# Patient Record
Sex: Male | Born: 1980 | Race: White | Hispanic: No | Smoking: Current every day smoker
Health system: Southern US, Community
[De-identification: ages and names within clinical notes are randomized; demographics above are authoritative.]

## PROBLEM LIST (undated history)

## (undated) DIAGNOSIS — G4730 Sleep apnea, unspecified: Secondary | ICD-10-CM

## (undated) DIAGNOSIS — E7800 Pure hypercholesterolemia, unspecified: Secondary | ICD-10-CM

## (undated) DIAGNOSIS — K219 Gastro-esophageal reflux disease without esophagitis: Secondary | ICD-10-CM

## (undated) DIAGNOSIS — E785 Hyperlipidemia, unspecified: Secondary | ICD-10-CM

## (undated) DIAGNOSIS — I10 Essential (primary) hypertension: Secondary | ICD-10-CM

## (undated) DIAGNOSIS — F419 Anxiety disorder, unspecified: Secondary | ICD-10-CM

## (undated) DIAGNOSIS — F32A Depression, unspecified: Secondary | ICD-10-CM

## (undated) HISTORY — DX: Depression, unspecified: F32.A

## (undated) HISTORY — PX: FOOT SURGERY: SHX648

## (undated) HISTORY — DX: Gastro-esophageal reflux disease without esophagitis: K21.9

## (undated) HISTORY — DX: Anxiety disorder, unspecified: F41.9

## (undated) HISTORY — PX: TONSILLECTOMY: SUR1361

## (undated) HISTORY — DX: Hyperlipidemia, unspecified: E78.5

## (undated) HISTORY — DX: Sleep apnea, unspecified: G47.30

## (undated) HISTORY — DX: Essential (primary) hypertension: I10

---

## 2017-06-30 DIAGNOSIS — Z720 Tobacco use: Secondary | ICD-10-CM | POA: Insufficient documentation

## 2017-06-30 DIAGNOSIS — K529 Noninfective gastroenteritis and colitis, unspecified: Secondary | ICD-10-CM | POA: Insufficient documentation

## 2017-06-30 DIAGNOSIS — A419 Sepsis, unspecified organism: Secondary | ICD-10-CM | POA: Insufficient documentation

## 2018-05-18 DIAGNOSIS — E119 Type 2 diabetes mellitus without complications: Secondary | ICD-10-CM | POA: Insufficient documentation

## 2019-05-06 ENCOUNTER — Other Ambulatory Visit: Payer: Self-pay

## 2019-05-06 ENCOUNTER — Encounter (HOSPITAL_COMMUNITY): Payer: Self-pay

## 2019-05-06 DIAGNOSIS — Z5321 Procedure and treatment not carried out due to patient leaving prior to being seen by health care provider: Secondary | ICD-10-CM | POA: Insufficient documentation

## 2019-05-06 DIAGNOSIS — Z202 Contact with and (suspected) exposure to infections with a predominantly sexual mode of transmission: Secondary | ICD-10-CM | POA: Insufficient documentation

## 2019-05-06 LAB — URINALYSIS, ROUTINE W REFLEX MICROSCOPIC
Bacteria, UA: NONE SEEN
Bilirubin Urine: NEGATIVE
Glucose, UA: NEGATIVE mg/dL
Hgb urine dipstick: NEGATIVE
Ketones, ur: NEGATIVE mg/dL
Nitrite: NEGATIVE
Protein, ur: 30 mg/dL — AB
Specific Gravity, Urine: 1.021 (ref 1.005–1.030)
WBC, UA: >50 WBC/hpf — ABNORMAL HIGH (ref 0–5)
pH: 5 (ref 5.0–8.0)

## 2019-05-06 NOTE — ED Triage Notes (Signed)
Pt reports green penile discharge x 4 days. Also reports burning with urination. Endorses recent unprotected sex.

## 2019-05-07 ENCOUNTER — Emergency Department (HOSPITAL_COMMUNITY)
Admission: EM | Admit: 2019-05-07 | Discharge: 2019-05-07 | Disposition: A | Discharge status: Home / Self Care | Payer: Self-pay | Attending: Emergency Medicine | Admitting: Emergency Medicine

## 2019-06-12 DIAGNOSIS — F605 Obsessive-compulsive personality disorder: Secondary | ICD-10-CM | POA: Insufficient documentation

## 2019-06-12 DIAGNOSIS — F4310 Post-traumatic stress disorder, unspecified: Secondary | ICD-10-CM | POA: Insufficient documentation

## 2019-10-02 ENCOUNTER — Other Ambulatory Visit: Payer: Self-pay

## 2019-10-02 ENCOUNTER — Encounter (HOSPITAL_COMMUNITY): Payer: Self-pay

## 2019-10-02 ENCOUNTER — Emergency Department (HOSPITAL_COMMUNITY)
Admission: EM | Admit: 2019-10-02 | Discharge: 2019-10-02 | Disposition: A | Discharge status: Home / Self Care | Payer: BLUE CROSS/BLUE SHIELD | Attending: Emergency Medicine | Admitting: Emergency Medicine

## 2019-10-02 DIAGNOSIS — R197 Diarrhea, unspecified: Secondary | ICD-10-CM | POA: Insufficient documentation

## 2019-10-02 DIAGNOSIS — R112 Nausea with vomiting, unspecified: Secondary | ICD-10-CM | POA: Insufficient documentation

## 2019-10-02 DIAGNOSIS — R1030 Lower abdominal pain, unspecified: Secondary | ICD-10-CM | POA: Diagnosis present

## 2019-10-02 DIAGNOSIS — E860 Dehydration: Secondary | ICD-10-CM | POA: Diagnosis not present

## 2019-10-02 HISTORY — DX: Pure hypercholesterolemia, unspecified: E78.00

## 2019-10-02 LAB — CBC WITH DIFFERENTIAL/PLATELET
Abs Immature Granulocytes: 0.03 10*3/uL (ref 0.00–0.07)
Basophils Absolute: 0 10*3/uL (ref 0.0–0.1)
Basophils Relative: 0 %
Eosinophils Absolute: 0.1 10*3/uL (ref 0.0–0.5)
Eosinophils Relative: 1 %
HCT: 39.5 % (ref 39.0–52.0)
Hemoglobin: 14.4 g/dL (ref 13.0–17.0)
Immature Granulocytes: 0 %
Lymphocytes Relative: 15 %
Lymphs Abs: 1.3 10*3/uL (ref 0.7–4.0)
MCH: 34.2 pg — ABNORMAL HIGH (ref 26.0–34.0)
MCHC: 36.5 g/dL — ABNORMAL HIGH (ref 30.0–36.0)
MCV: 93.8 fL (ref 80.0–100.0)
Monocytes Absolute: 1 10*3/uL (ref 0.1–1.0)
Monocytes Relative: 12 %
Neutro Abs: 6 10*3/uL (ref 1.7–7.7)
Neutrophils Relative %: 72 %
Platelets: 159 10*3/uL (ref 150–400)
RBC: 4.21 MIL/uL — ABNORMAL LOW (ref 4.22–5.81)
RDW: 11.6 % (ref 11.5–15.5)
WBC: 8.4 10*3/uL (ref 4.0–10.5)
nRBC: 0 % (ref 0.0–0.2)

## 2019-10-02 LAB — URINALYSIS, ROUTINE W REFLEX MICROSCOPIC
Bacteria, UA: NONE SEEN
Bilirubin Urine: NEGATIVE
Glucose, UA: NEGATIVE mg/dL
Hgb urine dipstick: NEGATIVE
Ketones, ur: NEGATIVE mg/dL
Leukocytes,Ua: NEGATIVE
Nitrite: NEGATIVE
Protein, ur: 30 mg/dL — AB
Specific Gravity, Urine: 1.029 (ref 1.005–1.030)
pH: 6 (ref 5.0–8.0)

## 2019-10-02 LAB — COMPREHENSIVE METABOLIC PANEL WITH GFR
ALT: 38 U/L (ref 0–44)
AST: 38 U/L (ref 15–41)
Albumin: 4.1 g/dL (ref 3.5–5.0)
Alkaline Phosphatase: 54 U/L (ref 38–126)
Anion gap: 9 (ref 5–15)
BUN: 27 mg/dL — ABNORMAL HIGH (ref 6–20)
CO2: 23 mmol/L (ref 22–32)
Calcium: 8.4 mg/dL — ABNORMAL LOW (ref 8.9–10.3)
Chloride: 105 mmol/L (ref 98–111)
Creatinine, Ser: 1.4 mg/dL — ABNORMAL HIGH (ref 0.61–1.24)
GFR calc Af Amer: >60 mL/min
GFR calc non Af Amer: >60 mL/min
Glucose, Bld: 108 mg/dL — ABNORMAL HIGH (ref 70–99)
Potassium: 3.1 mmol/L — ABNORMAL LOW (ref 3.5–5.1)
Sodium: 137 mmol/L (ref 135–145)
Total Bilirubin: 1.5 mg/dL — ABNORMAL HIGH (ref 0.3–1.2)
Total Protein: 6.5 g/dL (ref 6.5–8.1)

## 2019-10-02 LAB — CK: Total CK: 919 U/L — ABNORMAL HIGH (ref 49–397)

## 2019-10-02 LAB — MAGNESIUM: Magnesium: 2 mg/dL (ref 1.7–2.4)

## 2019-10-02 LAB — LIPASE, BLOOD: Lipase: 21 U/L (ref 11–51)

## 2019-10-02 MED ORDER — ONDANSETRON HCL 4 MG/2ML IJ SOLN
4.0000 mg | Freq: Once | INTRAMUSCULAR | Status: AC
  Administered 2019-10-02: 4 mg via INTRAVENOUS
  Filled 2019-10-02: qty 2

## 2019-10-02 MED ORDER — SODIUM CHLORIDE 0.9 % IV BOLUS
1000.0000 mL | Freq: Once | INTRAVENOUS | Status: AC
  Administered 2019-10-02: 1000 mL via INTRAVENOUS

## 2019-10-02 MED ORDER — LACTATED RINGERS IV BOLUS
1000.0000 mL | Freq: Once | INTRAVENOUS | Status: AC
  Administered 2019-10-02: 1000 mL via INTRAVENOUS

## 2019-10-02 MED ORDER — ONDANSETRON HCL 4 MG PO TABS: 4.0000 mg | ORAL_TABLET | Freq: Three times a day (TID) | ORAL | 0 refills | Status: DC | PRN

## 2019-10-02 NOTE — ED Provider Notes (Signed)
[facility]-EMERGENCY DEPT Provider Note   CSN: [id number] Arrival date & time: 10/02/19  1644     History Chief Complaint  Patient presents with  . Abdominal Pain  . Dehydration    Billy Mann is a 39 y.o. male.  The history is provided by the patient and medical records. No language interpreter was used.  Abdominal Pain  Billy Mann is a 39 y.o. male who presents to the Emergency Department complaining of vomiting and diarrhea. He presents the emergency department complaining of vomiting and diarrhea and feeling dehydrated. He states that he relapsed on meth 3 days ago. He has been injecting IV for two days. Yesterday he developed numerous episodes of vomiting, diarrhea, abdominal cramping, body aches, shortness of breath and temperature to 100. He states he has had no urine output for the last two days. He had similar symptoms in the past related to renal failure. Denies any chest pain, COVID 19 exposures. He has been vaccinated for coronavirus. Prior to ED arrival he received 1500 mL of fluids as well as 4 mg of Zofran IV.    Past Medical History:  Diagnosis Date  . High cholesterol     There are no problems to display for this patient.   History reviewed. No pertinent surgical history.     No family history on file.  Social History   Tobacco Use  . Smoking status: Not on file  Substance Use Topics  . Alcohol use: Never  . Drug use: Yes    Types: Methamphetamines    Home Medications Prior to Admission medications   Medication Sig Start Date End Date Taking? Authorizing Provider  ondansetron (ZOFRAN) 4 MG tablet Take 1 tablet (4 mg total) by mouth every 8 (eight) hours as needed for nausea or vomiting. 10/02/19   Tilden Fossa, MD    Allergies    Penicillins  Review of Systems   Review of Systems  Gastrointestinal: Positive for abdominal pain.  All other systems reviewed and are negative.   Physical Exam Updated Vital  Signs BP 118/77   Pulse 79   Temp 98.4 F (36.9 C) (Oral)   Resp 19   SpO2 100%   Physical Exam Vitals and nursing note reviewed.  Constitutional:      Appearance: He is well-developed.  HENT:     Head: Normocephalic and atraumatic.     Mouth/Throat:     Mouth: Mucous membranes are dry.  Cardiovascular:     Rate and Rhythm: Normal rate and regular rhythm.     Heart sounds: No murmur heard.   Pulmonary:     Effort: Pulmonary effort is normal. No respiratory distress.     Breath sounds: Normal breath sounds.  Abdominal:     Palpations: Abdomen is soft.     Tenderness: There is no abdominal tenderness. There is no guarding or rebound.  Musculoskeletal:        General: No tenderness.  Skin:    General: Skin is warm and dry.  Neurological:     Mental Status: He is alert and oriented to person, place, and time.  Psychiatric:        Behavior: Behavior normal.     ED Results / Procedures / Treatments   Labs (all labs ordered are listed, but only abnormal results are displayed) Labs Reviewed  COMPREHENSIVE METABOLIC PANEL - Abnormal; Notable for the following components:      Result Value   Potassium 3.1 (*)    Glucose, Bld  108 (*)    BUN 27 (*)    Creatinine, Ser 1.40 (*)    Calcium 8.4 (*)    Total Bilirubin 1.5 (*)    All other components within normal limits  CBC WITH DIFFERENTIAL/PLATELET - Abnormal; Notable for the following components:   RBC 4.21 (*)    MCH 34.2 (*)    MCHC 36.5 (*)    All other components within normal limits  URINALYSIS, ROUTINE W REFLEX MICROSCOPIC - Abnormal; Notable for the following components:   Color, Urine AMBER (*)    Protein, ur 30 (*)    All other components within normal limits  CK - Abnormal; Notable for the following components:   Total CK 919 (*)    All other components within normal limits  LIPASE, BLOOD  MAGNESIUM    EKG EKG Interpretation  Date/Time:  Monday October 02 2019 17:53:21 EDT Ventricular Rate:  82 PR  Interval:    QRS Duration: 95 QT Interval:  408 QTC Calculation: 477 R Axis:   63 Text Interpretation: Sinus rhythm Borderline prolonged QT interval Confirmed by Quintella Reichert [phone]) on 10/02/2019 6:14:05 PM   Radiology No results found.  Procedures Procedures (including critical care time)  Medications Ordered in ED Medications  sodium chloride 0.9 % bolus 1,000 mL (0 mLs Intravenous Stopped 10/02/19 2058)  ondansetron (ZOFRAN) injection 4 mg (4 mg Intravenous Given 10/02/19 1851)  lactated ringers bolus 1,000 mL (0 mLs Intravenous Stopped 10/02/19 2058)    ED Course  I have reviewed the triage vital signs and the nursing notes.  Pertinent labs & imaging results that were available during my care of the patient were reviewed by me and considered in my medical decision making (see chart for details).    MDM Rules/Calculators/A&P                         Pt with hx/o substance abuse here for evaluation of vomiting and diarrhea. He is dehydrated on evaluation as well is based on labs with elevation is BUN and creatinine. CPK is mildly elevated consistent with very mild rhabdomyolysis. Patient is eating and drinking in the emergency department without difficulty, making urine. Discussed with patient home care for dehydration plan to discharge home with outpatient follow-up as well as return precautions.  Presentation is not consistent with sepsis, serious bacterial infection, bowel obstruction, appendicitis, cholecystitis. Final Clinical Impression(s) / ED Diagnoses Final diagnoses:  Dehydration  Nausea vomiting and diarrhea    Rx / DC Orders ED Discharge Orders         Ordered    ondansetron (ZOFRAN) 4 MG tablet  Every 8 hours PRN     Discontinue  Reprint     10/02/19 1948           Quintella Reichert, MD 10/02/19 2346

## 2019-10-02 NOTE — ED Triage Notes (Signed)
Pt BIBA from home. Pt c/o lower abd pain, along with N/V/D. Pt relapsed on meth this weekend,  Had 2 days of use. In 2014, pt had rhabdo, concerned it is the same.  130 HR initially  12 lead unremarkable with EMS.  EMS states no saliva in mouth, no urine output.  Received 1500cc fluids en route, 4mg  zofran.  126/83 90 HR after fluids

## 2019-10-02 NOTE — ED Notes (Signed)
Patient offered food and beverage.  

## 2019-10-02 NOTE — ED Notes (Signed)
Patient tolerated food and beverage well.  

## 2019-11-29 ENCOUNTER — Ambulatory Visit (HOSPITAL_COMMUNITY): Payer: Self-pay | Admitting: Licensed Clinical Social Worker

## 2019-11-29 ENCOUNTER — Other Ambulatory Visit: Payer: Self-pay

## 2019-11-29 ENCOUNTER — Encounter (HOSPITAL_COMMUNITY): Payer: Self-pay | Admitting: Psychiatry

## 2019-11-29 ENCOUNTER — Telehealth (INDEPENDENT_AMBULATORY_CARE_PROVIDER_SITE_OTHER): Payer: Self-pay | Admitting: Psychiatry

## 2019-11-29 DIAGNOSIS — F411 Generalized anxiety disorder: Secondary | ICD-10-CM

## 2019-11-29 DIAGNOSIS — F330 Major depressive disorder, recurrent, mild: Secondary | ICD-10-CM

## 2019-11-29 MED ORDER — TRAZODONE HCL 50 MG PO TABS: 50.0000 mg | ORAL_TABLET | Freq: Every day | ORAL | 2 refills | Status: DC

## 2019-11-29 MED ORDER — MIRTAZAPINE 15 MG PO TABS: 15.0000 mg | ORAL_TABLET | Freq: Every day | ORAL | 2 refills | Status: DC

## 2019-11-29 NOTE — Progress Notes (Signed)
Psychiatric Initial Adult Assessment  Virtual Visit via Video Note  I connected with Billy Mann on 11/29/19 at  2:00 PM EDT by a video enabled telemedicine application and verified that I am speaking with the correct person using two identifiers.  Location: Patient: Home Provider: Clinic   I discussed the limitations of evaluation and management by telemedicine and the availability of in person appointments. The patient expressed understanding and agreed to proceed.  I provided 45 minutes of non-face-to-face time during this encounter.     Patient Identification: Billy Mann MRN:  [id number] Date of Evaluation:  11/29/2019 Referral Source:  Chief Complaint: "I've only been taking trazodone. The other meds made me sleepy and I couldn't function"  Visit Diagnosis:    ICD-10-CM   1. Generalized anxiety disorder  F41.1 mirtazapine (REMERON) 15 MG tablet  2. Mild episode of recurrent major depressive disorder (HCC)  F33.0 traZODone (DESYREL) 50 MG tablet    History of Present Illness:  39 year old male seen today for initial psychiatric evaluation.  He has a psychiatric history PTSD, bipolar disorder, alcohol use disorder (in remission), cannabis use disorder (in remission), depression, and OCD.  He is currently being managed on trazodone 50 mg at bedtime, Zoloft 50 mg, Paxil 20 mg, hydroxyzine 25 mg 3 times a day, and gabapentin 100 mg 3 times daily.  Today he notes that his depressive symptoms are improving however endorses anhedonia, insomnia, feelings of guilt, difficulty concentrating, anxiety, and panic attacks.  Patient notes that he stopped taking other medications because they were sedating, cause weight gain, and decreased his sex drive.  Patient endorses auditory hallucinations.  He reports that at times he hears people calling his name or hear people whispering.  Provider that he was adopted as a child.  He notes that he has a close relationship with his biological  sisters as they were adopted by his parents best friend.  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  He informed provider that working there keeps him busy and produces feelings of depression however notes that at times he is still anxious.  Reports that he copes with his anxiety by counseling with one of his cats.  He is agreeable to starting Remeron 15 mg at bedtime to help improve symptoms of depression, anxiety, and sleep. Potential side effects of medication and risks vs benefits of treatment vs non-treatment were explained and discussed. All questions were answered. Patient will follow up in 2 months with provider.  If depressive symptoms, anxiety, and auditory is not reduced patient is agreeable to potentially starting Seroquel and BuSpar.  No other concerns noted at this time.  Associated Signs/Symptoms: Depression Symptoms:  depressed mood, anhedonia, insomnia, feelings of worthlessness/guilt, difficulty concentrating, anxiety, panic attacks, (Hypo) Manic Symptoms:  Distractibility, Flight of Ideas, Impulsivity, Anxiety Symptoms:  Obsessive Compulsive Symptoms:   Cleaning home constantly, Psychotic Symptoms:  Hallucinations: Auditory PTSD Symptoms: NA  Past Psychiatric History: PTSD, bipolar disorder, alcohol use disorder, cannabis use disorder, OCD, and depression  Previous Psychotropic Medications: Trialed Zoloft, Paxil, hydroxyzine, and gabapentin  Substance Abuse History in the last 12 months:  No.  Consequences of Substance Abuse: NA  Past Medical History:  Past Medical History:  Diagnosis Date  . High cholesterol    History reviewed. No pertinent surgical history.  Family Psychiatric History: Patient adopted and does not know biological family history  Family History: History reviewed. No pertinent family history.  Social History:   Social History  Socioeconomic History  . Marital status: Single    Spouse name: Not  on file  . Number of children: Not on file  . Years of education: Not on file  . Highest education level: Not on file  Occupational History  . Not on file  Tobacco Use  . Smoking status: Not on file  Substance and Sexual Activity  . Alcohol use: Never  . Drug use: Yes    Types: Methamphetamines  . Sexual activity: Not on file  Other Topics Concern  . Not on file  Social History Narrative  . Not on file   Social Determinants of Health   Financial Resource Strain:   . Difficulty of Paying Living Expenses:   Food Insecurity:   . Worried About Programme researcher, broadcasting/film/video in the Last Year:   . Barista in the Last Year:   Transportation Needs:   . Freight forwarder (Medical):   Marland Kitchen Lack of Transportation (Non-Medical):   Physical Activity:   . Days of Exercise per Week:   . Minutes of Exercise per Session:   Stress:   . Feeling of Stress :   Social Connections:   . Frequency of Communication with Friends and Family:   . Frequency of Social Gatherings with Friends and Family:   . Attends Religious Services:   . Active Member of Clubs or Organizations:   . Attends Banker Meetings:   Marland Kitchen Marital Status:     Additional Social History: Patient resides in [place] with his boyfriend of 1 year.  They have no children.  He denies alcohol, tobacco, or illicit drug use.  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  Allergies:   Allergies  Allergen Reactions  . Penicillins Anaphylaxis    Metabolic Disorder Labs: No results found for: HGBA1C, MPG No results found for: PROLACTIN No results found for: CHOL, TRIG, HDL, CHOLHDL, VLDL, LDLCALC No results found for: TSH  Therapeutic Level Labs: No results found for: LITHIUM No results found for: CBMZ No results found for: VALPROATE  Current Medications: Current Outpatient Medications  Medication Sig Dispense Refill  . mirtazapine (REMERON) 15 MG tablet Take 1 tablet (15 mg total) by mouth at  bedtime. 30 tablet 2  . ondansetron (ZOFRAN) 4 MG tablet Take 1 tablet (4 mg total) by mouth every 8 (eight) hours as needed for nausea or vomiting. 10 tablet 0  . traZODone (DESYREL) 50 MG tablet Take 1 tablet (50 mg total) by mouth at bedtime. 30 tablet 2   No current facility-administered medications for this visit.    Musculoskeletal: Strength & Muscle Tone: Unable to assess due to telehealth visit Gait & Station: normal, Unable to assess due to telehealth visit Patient leans: N/A  Psychiatric Specialty Exam: Review of Systems  There were no vitals taken for this visit.There is no height or weight on file to calculate BMI.  General Appearance: Well Groomed  Eye Contact:  Good  Speech:  Clear and Coherent and Normal Rate  Volume:  Normal  Mood:  Anxious and Depressed  Affect:  Congruent  Thought Process:  Coherent, Goal Directed and Linear  Orientation:  Full (Time, Place, and Person)  Thought Content:  WDL, Logical and Hallucinations: Auditory  Suicidal Thoughts:  No  Homicidal Thoughts:  No  Memory:  Immediate;   Good Recent;   Good Remote;   Good  Judgement:  Good  Insight:  Good  Psychomotor Activity:  Normal  Concentration:  Concentration: Fair and Attention Span: Good  Recall:  Good  Fund of Knowledge:Good  Language: Good  Akathisia:  No  Handed:  Right  AIMS (if indicated):  Not done  Assets:  Communication Skills Desire for Improvement Financial Resources/Insurance Housing Social Support  ADL's:  Intact  Cognition: WNL  Sleep:  Poor   Screenings:   Assessment and Plan: Patient endorses symptoms of depression, anxiety, and auditory hallucinations. He is agreeable to starting Remeron 15 mg at bedtime to help improve symptoms of depression, anxiety, and sleep. Potential side effects of medication and risks vs benefits of treatment vs non-treatment were explained and discussed. All questions were answered. Patient will follow up in 2 months with provider.  If  depressive symptoms, anxiety, and auditory is not reduced patient is agreeable to potentially starting Seroquel and BuSpar.   1. Mild episode of recurrent major depressive disorder (HCC) Continue- traZODone (DESYREL) 50 MG tablet; Take 1 tablet (50 mg total) by mouth at bedtime.  Dispense: 30 tablet; Refill: 2  2. Generalized anxiety disorder  Continue- mirtazapine (REMERON) 15 MG tablet; Take 1 tablet (15 mg total) by mouth at bedtime.  Dispense: 30 tablet; Refill: 2  Follow-up in 2 months Follow-up with therapy    Shanna Cisco, NP 8/11/20212:21 PM

## 2020-01-03 ENCOUNTER — Other Ambulatory Visit: Payer: Self-pay

## 2020-01-03 ENCOUNTER — Ambulatory Visit: Payer: 59 | Admitting: Podiatry

## 2020-01-03 DIAGNOSIS — M2041 Other hammer toe(s) (acquired), right foot: Secondary | ICD-10-CM | POA: Diagnosis not present

## 2020-01-03 DIAGNOSIS — M79671 Pain in right foot: Secondary | ICD-10-CM | POA: Diagnosis not present

## 2020-01-03 DIAGNOSIS — M2042 Other hammer toe(s) (acquired), left foot: Secondary | ICD-10-CM

## 2020-01-03 DIAGNOSIS — L989 Disorder of the skin and subcutaneous tissue, unspecified: Secondary | ICD-10-CM

## 2020-01-03 DIAGNOSIS — M79672 Pain in left foot: Secondary | ICD-10-CM

## 2020-01-07 NOTE — Progress Notes (Signed)
   HPI: 39 y.o. male presenting today as a new patient for evaluation of pain to the bilateral feet.  Patient states that he developed symptomatic calluses to the bilateral feet that have been present for several years.  They are very painful and tender to palpation.  He has been seeing Dr. Elvin So, [facility], for the calluses on the lesions for several years with minimal relief.  He states that the calluses get trimmed down but they immediately come back.  He only gets relief for a day or 2.  At 1 point they did discuss possible surgery and correction of the hammertoes to alleviate pressure from the ball of his feet.  He presents today for a second opinion and for further treatment and evaluation  Past Medical History:  Diagnosis Date  . High cholesterol       Objective: Physical Exam General: The patient is alert and oriented x3 in no acute distress.  Dermatology: Skin is cool, dry and supple bilateral lower extremities. Negative for open lesions or macerations.  Hyperkeratotic preulcerative callus lesions with a central nucleated core noted throughout the weightbearing surfaces of the bilateral feet especially on the forefoot.  Associated pain to palpation  Vascular: Palpable pedal pulses bilaterally. No edema or erythema noted. Capillary refill within normal limits.  Neurological: Epicritic and protective threshold grossly intact bilaterally.   Musculoskeletal Exam: All pedal and ankle joints range of motion within normal limits bilateral. Muscle strength 5/5 in all groups bilateral. Hammertoe contracture deformity noted to digits 2-5 of the bilateral foot  Assessment: 1.  Porokeratosis bilateral forefoot 2.  Hammertoes 2-5 bilateral   Plan of Care:  1. Patient evaluated.  2.  The patient is attempted and tried multiple conservative modalities to alleviate the patient's pain and symptoms with no relief.  He has tried excisional debridement of the calluses, OTC corn and  callus remover, insoles, and shoe gear modifications with minimal to no relief.  At this point I do believe that surgical intervention and correction of the hammertoes may alleviate pressure from the ball of the foot and reduce pressure from the porokeratosis lesions of the sub-MTPJ's. 3. Today we discussed the conservative versus surgical management of the presenting pathology. The patient opts for surgical management. All possible complications and details of the procedure were explained. All patient questions were answered. No guarantees were expressed or implied. 4. Authorization for surgery was initiated today. Surgery will consist of PIPJ arthroplasty with MTPJ capsulotomy digits 2-5 bilateral 5.  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 Felecia Shelling, DPM    [address] [place], [place] [address]                Office [phone]  Fax [phone]

## 2020-01-19 ENCOUNTER — Telehealth: Payer: Self-pay

## 2020-01-19 NOTE — Telephone Encounter (Signed)
DOS 01/25/2020  HAMMERTOE REPAIR 2-5 B/L - 56979 CAPSULOTOMY 2-5 B/L - [id number]  RECEIVED AUTH FAX FROM [facility].  CPT [id number] & [id number] APPROVED AUTH # [id number]  GOOD FROM 01/18/2020 - 04/17/2020

## 2020-01-23 ENCOUNTER — Other Ambulatory Visit: Payer: Self-pay

## 2020-01-23 ENCOUNTER — Ambulatory Visit (HOSPITAL_COMMUNITY): Payer: 59 | Admitting: Licensed Clinical Social Worker

## 2020-01-23 ENCOUNTER — Telehealth (HOSPITAL_COMMUNITY): Payer: Self-pay | Admitting: Licensed Clinical Social Worker

## 2020-01-23 NOTE — Telephone Encounter (Signed)
LCSW sent two links to pt cell phone  number provided in epic with no response. LCSW f/u with a PC the phone rang twice and a man did answer LCSW asked for [facility]. After about 5 seconds phone disconnected

## 2020-01-24 ENCOUNTER — Encounter: Payer: 59 | Admitting: Podiatry

## 2020-01-25 ENCOUNTER — Other Ambulatory Visit: Payer: Self-pay | Admitting: Podiatry

## 2020-01-25 DIAGNOSIS — M2042 Other hammer toe(s) (acquired), left foot: Secondary | ICD-10-CM | POA: Diagnosis not present

## 2020-01-25 DIAGNOSIS — M2041 Other hammer toe(s) (acquired), right foot: Secondary | ICD-10-CM | POA: Diagnosis not present

## 2020-01-25 MED ORDER — OXYCODONE-ACETAMINOPHEN 10-325 MG PO TABS: 1.0000 | ORAL_TABLET | ORAL | 0 refills | Status: AC | PRN

## 2020-01-25 MED ORDER — IBUPROFEN 800 MG PO TABS: 800.0000 mg | ORAL_TABLET | Freq: Three times a day (TID) | ORAL | 1 refills | Status: DC

## 2020-01-25 NOTE — Progress Notes (Signed)
PRN postop 

## 2020-01-31 ENCOUNTER — Encounter: Payer: 59 | Admitting: Podiatry

## 2020-01-31 ENCOUNTER — Ambulatory Visit (INDEPENDENT_AMBULATORY_CARE_PROVIDER_SITE_OTHER): Payer: 59

## 2020-01-31 ENCOUNTER — Other Ambulatory Visit: Payer: Self-pay

## 2020-01-31 ENCOUNTER — Ambulatory Visit (INDEPENDENT_AMBULATORY_CARE_PROVIDER_SITE_OTHER): Payer: 59 | Admitting: Podiatry

## 2020-01-31 DIAGNOSIS — M2041 Other hammer toe(s) (acquired), right foot: Secondary | ICD-10-CM

## 2020-01-31 DIAGNOSIS — M2042 Other hammer toe(s) (acquired), left foot: Secondary | ICD-10-CM

## 2020-01-31 DIAGNOSIS — Z9889 Other specified postprocedural states: Secondary | ICD-10-CM

## 2020-01-31 MED ORDER — HYDROCODONE-ACETAMINOPHEN 5-325 MG PO TABS: 1.0000 | ORAL_TABLET | Freq: Four times a day (QID) | ORAL | 0 refills | Status: DC | PRN

## 2020-01-31 NOTE — Addendum Note (Signed)
Addended by: Felecia Shelling on: 01/31/2020 02:19 PM   Modules accepted: Orders

## 2020-01-31 NOTE — Progress Notes (Signed)
   Subjective:  Patient presents today status post hammertoe repair digits 2-5 bilateral. DOS: 01/25/2020.  Patient states he is doing very well.  He did have some swelling to the bilateral foot and ankles over the week but it has not progressed past his ankles.  Pain is tolerated however he states that the Percocet is making him nauseous.  He presents for further treatment evaluation no new complaints at this time  Past Medical History:  Diagnosis Date  . High cholesterol       Objective/Physical Exam Neurovascular status intact.  Skin incisions appear to be well coapted with staples intact. No sign of infectious process noted. No dehiscence. No active bleeding noted. Moderate edema noted to the surgical extremity.  Radiographic Exam:  Percutaneous K wires and osteotomies sites appear to be stable with routine healing.  Toes are in a good rectus alignment  Assessment: 1. s/p hammertoe repair digits 2-5 bilateral. DOS: 01/25/2020   Plan of Care:  1. Patient was evaluated. X-rays reviewed 2.  Dressings changed today.  Keep clean dry and intact x1 week 3.  Continue minimal weightbearing in the postsurgical shoes bilateral 4.  Prescription for Vicodin 5/325 mg 5.  Continue ibuprofen 800 mg 3 times daily 6.  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 Felecia Shelling, DPM    [address]                                        [place], [place] [address]                Office [phone]  Fax [phone]

## 2020-02-07 ENCOUNTER — Other Ambulatory Visit: Payer: Self-pay

## 2020-02-07 ENCOUNTER — Ambulatory Visit (INDEPENDENT_AMBULATORY_CARE_PROVIDER_SITE_OTHER): Payer: 59 | Admitting: Podiatry

## 2020-02-07 DIAGNOSIS — L989 Disorder of the skin and subcutaneous tissue, unspecified: Secondary | ICD-10-CM

## 2020-02-07 DIAGNOSIS — M79671 Pain in right foot: Secondary | ICD-10-CM

## 2020-02-07 DIAGNOSIS — M2041 Other hammer toe(s) (acquired), right foot: Secondary | ICD-10-CM

## 2020-02-07 DIAGNOSIS — M2042 Other hammer toe(s) (acquired), left foot: Secondary | ICD-10-CM

## 2020-02-07 DIAGNOSIS — M79672 Pain in left foot: Secondary | ICD-10-CM

## 2020-02-07 DIAGNOSIS — Z9889 Other specified postprocedural states: Secondary | ICD-10-CM

## 2020-02-07 MED ORDER — HYDROCODONE-ACETAMINOPHEN 10-325 MG PO TABS: 1.0000 | ORAL_TABLET | Freq: Four times a day (QID) | ORAL | 0 refills | Status: AC | PRN

## 2020-02-07 NOTE — Progress Notes (Signed)
   Subjective:  Patient presents today status post hammertoe repair digits 2-5 bilateral. DOS: 01/25/2020.  Patient states that he is doing well however the Vicodin 5/325 mg not helping to alleviate his pain.  He states that he is having to take 2 of the pills at the same time.  Otherwise he has been minimally weightbearing and postsurgical shoes as directed.  No new complaints at this time  Past Medical History:  Diagnosis Date  . High cholesterol       Objective/Physical Exam Neurovascular status intact.  Skin incisions appear to be well coapted with staples intact. No sign of infectious process noted. No dehiscence. No active bleeding noted. Moderate edema noted to the surgical extremity.  Radiographic Exam:  Percutaneous K wires and osteotomies sites appear to be stable with routine healing.  Toes are in a good rectus alignment  Assessment: 1. s/p hammertoe repair digits 2-5 bilateral. DOS: 01/25/2020   Plan of Care:  1. Patient was evaluated.  Partial staples removed today 2.  Dressings changed today.  Keep clean dry and intact x1 week 3.  Continue minimal weightbearing in the postsurgical shoes bilateral 4.  Refill prescription for Vicodin 10/325 mg 5.  Continue ibuprofen 800 mg 3 times daily 6.  Return to clinic in 1 week for remaining staple removal.  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 Felecia Shelling, DPM    [address] [place], [place] [address]                Office [phone]  Fax [phone]

## 2020-02-14 ENCOUNTER — Other Ambulatory Visit: Payer: Self-pay

## 2020-02-14 ENCOUNTER — Encounter: Payer: 59 | Admitting: Podiatry

## 2020-02-14 ENCOUNTER — Ambulatory Visit: Payer: 59

## 2020-02-14 ENCOUNTER — Ambulatory Visit (INDEPENDENT_AMBULATORY_CARE_PROVIDER_SITE_OTHER): Payer: 59 | Admitting: Podiatry

## 2020-02-14 DIAGNOSIS — M79672 Pain in left foot: Secondary | ICD-10-CM

## 2020-02-14 DIAGNOSIS — M79671 Pain in right foot: Secondary | ICD-10-CM

## 2020-02-14 MED ORDER — OXYCODONE-ACETAMINOPHEN 5-325 MG PO TABS: 1.0000 | ORAL_TABLET | Freq: Four times a day (QID) | ORAL | 0 refills | Status: DC | PRN

## 2020-02-14 NOTE — Progress Notes (Signed)
   Subjective:  Patient presents today status post hammertoe repair digits 2-5 bilateral. DOS: 01/25/2020.  Patient is doing very well.  No new complaints at this time  Past Medical History:  Diagnosis Date  . High cholesterol       Objective/Physical Exam Neurovascular status intact.  Skin incisions appear to be well coapted with staples intact. No sign of infectious process noted. No dehiscence. No active bleeding noted. Moderate edema noted to the surgical extremity.  Radiographic Exam:  Percutaneous K wires and osteotomies sites appear to be stable with routine healing.  Toes are in a good rectus alignment  Assessment: 1. s/p hammertoe repair digits 2-5 bilateral. DOS: 01/25/2020   Plan of Care:  1. Patient was evaluated.  Remaining staples removed today. 2.  Dry sterile dressings were applied today.  Keep clean dry and intact x2 weeks.  Fresh Ace wraps and stockinettes were supplied for the patient to change throughout the week 3.  Return to clinic in 2 weeks for percutaneous fixation pin removal. 4.  Refill prescription for Percocet 5/325 mg every 6 hours #30  5.  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 Felecia Shelling, DPM    [address] [place], [place] [address]                Office [phone]  Fax [phone]

## 2020-02-19 ENCOUNTER — Telehealth (INDEPENDENT_AMBULATORY_CARE_PROVIDER_SITE_OTHER): Payer: 59 | Admitting: Psychiatry

## 2020-02-19 ENCOUNTER — Encounter (HOSPITAL_COMMUNITY): Payer: Self-pay | Admitting: Psychiatry

## 2020-02-19 ENCOUNTER — Other Ambulatory Visit: Payer: Self-pay

## 2020-02-19 DIAGNOSIS — F605 Obsessive-compulsive personality disorder: Secondary | ICD-10-CM | POA: Diagnosis not present

## 2020-02-19 DIAGNOSIS — F323 Major depressive disorder, single episode, severe with psychotic features: Secondary | ICD-10-CM | POA: Diagnosis not present

## 2020-02-19 DIAGNOSIS — F900 Attention-deficit hyperactivity disorder, predominantly inattentive type: Secondary | ICD-10-CM | POA: Diagnosis not present

## 2020-02-19 DIAGNOSIS — F411 Generalized anxiety disorder: Secondary | ICD-10-CM | POA: Diagnosis not present

## 2020-02-19 MED ORDER — ARIPIPRAZOLE 5 MG PO TABS: 5.0000 mg | ORAL_TABLET | Freq: Every day | ORAL | 2 refills | Status: DC

## 2020-02-19 MED ORDER — TRAZODONE HCL 50 MG PO TABS: 50.0000 mg | ORAL_TABLET | Freq: Every day | ORAL | 2 refills | Status: DC

## 2020-02-19 MED ORDER — ATOMOXETINE HCL 40 MG PO CAPS: 40.0000 mg | ORAL_CAPSULE | Freq: Every day | ORAL | 2 refills | Status: DC

## 2020-02-19 MED ORDER — FLUOXETINE HCL 20 MG PO CAPS: 20.0000 mg | ORAL_CAPSULE | Freq: Every day | ORAL | 2 refills | Status: DC

## 2020-02-19 NOTE — Progress Notes (Signed)
BH MD/PA/NP OP Progress Note Virtual Visit via Video Note  I connected with Billy Mann on 02/19/20 at  8:30 AM EDT by a video enabled telemedicine application and verified that I am speaking with the correct person using two identifiers.  Location: Patient: Home Provider: Clinic   I discussed the limitations of evaluation and management by telemedicine and the availability of in person appointments. The patient expressed understanding and agreed to proceed.  I provided 30 minutes of non-face-to-face time during this encounter.    02/19/2020 9:10 AM Maximos Zayas  MRN:  [id number]  Chief Complaint:  "I'm having a rough time with my anxiety especially with my OCD"   "HPI: 39 year old male seen today for follow up psychiatric evaluation.  He has a psychiatric history PTSD, bipolar disorder, alcohol use disorder (in remission), cannabis use disorder (in remission), depression, ADHD, and OCD.  He is currently being managed on trazodone and Remeron 15 mg nightly. He informed provider that he discontinued his Remeron because it made him sluggish and he noted that he felt like a zombie.  Today well-groomed, pleasant, cooperative, engaged in conversation, and maintained eye contact.  He informed provider that he discontinue Remeron and is now having increased anxiety and depression.  He states that his anxiety is centered around his OCD.  He notes that he received reconstructive surgery on his foot recently and notes he sits around the house worryong.  He notes that when things are not in order he can not get comfortable and stays on edge.  Provider conducted a GAD-7 and patient scored an 18.  He also endorses symptoms of depression such as disturbed sleep, feelings of worthlessness, weight gain (14 lb in 2 months), increased appetite, and poor concentration.  He denies SI/HI or paranoia.  Patient endorses auditory and visual hallucinations.  He notes that he  seea white shadows/people walk next  to him in his home. He also noted that he hears people call his name.   Patient also endorses symptoms of ADHD such as poor concentration, avoidance of mentally taxing task, disorganization, and forgetfulness.  He notes that the symptoms are interfering with his activities of daily living.  Patient reports that he is in school studying emergency management reports that he is unable to focus in class.  Patient agreeable to start Prozac 20 mg to help manage symptoms of depression, anxiety, and OCD.  He will also start Strattera 40 mg to help manage symptoms of ADHD.  He is also agreeable to starting Abilify 5 mg to help manage symptoms of psychosis. Potential side effects of medication and risks vs benefits of treatment vs non-treatment were explained and discussed. All questions were answered. He will continue all other medications as prescribed.  No other concerns noted at this time.  Visit Diagnosis:    ICD-10-CM   1. Obsessive-compulsive personality disorder (HCC)  F60.5 FLUoxetine (PROZAC) 20 MG capsule  2. Attention deficit hyperactivity disorder (ADHD), predominantly inattentive type  F90.0 atomoxetine (STRATTERA) 40 MG capsule  3. Generalized anxiety disorder  F41.1 FLUoxetine (PROZAC) 20 MG capsule  4. 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HCC)  F32.3 traZODone (DESYREL) 50 MG tablet    FLUoxetine (PROZAC) 20 MG capsule    ARIPiprazole (ABILIFY) 5 MG tablet    Past Psychiatric History: PTSD, bipolar disorder, alcohol use disorder, cannabis use disorder, OCD, and depression Past Medical History:  Past Medical History:  Diagnosis Date  . High cholesterol    History reviewed. No pertinent surgical history.  Family Psychiatric History: Patient adopted and does not know biological family history  Family History: History reviewed. No pertinent family history.  Social History:  Social History   Socioeconomic History  . Marital status: Single    Spouse name: Not on file  .  Number of children: Not on file  . Years of education: Not on file  . Highest education level: Not on file  Occupational History  . Not on file  Tobacco Use  . Smoking status: Not on file  Substance and Sexual Activity  . Alcohol use: Never  . Drug use: Yes    Types: Methamphetamines  . Sexual activity: Not on file  Other Topics Concern  . Not on file  Social History Narrative  . Not on file   Social Determinants of Health   Financial Resource Strain:   . Difficulty of Paying Living Expenses: Not on file  Food Insecurity:   . Worried About Programme researcher, broadcasting/film/video in the Last Year: Not on file  . Ran Out of Food in the Last Year: Not on file  Transportation Needs:   . Lack of Transportation (Medical): Not on file  . Lack of Transportation (Non-Medical): Not on file  Physical Activity:   . Days of Exercise per Week: Not on file  . Minutes of Exercise per Session: Not on file  Stress:   . Feeling of Stress : Not on file  Social Connections:   . Frequency of Communication with Friends and Family: Not on file  . Frequency of Social Gatherings with Friends and Family: Not on file  . Attends Religious Services: Not on file  . Active Member of Clubs or Organizations: Not on file  . Attends Banker Meetings: Not on file  . Marital Status: Not on file    Allergies:  Allergies  Allergen Reactions  . Penicillins Anaphylaxis    Metabolic Disorder Labs: No results found for: HGBA1C, MPG No results found for: PROLACTIN No results found for: CHOL, TRIG, HDL, CHOLHDL, VLDL, LDLCALC No results found for: TSH  Therapeutic Level Labs: No results found for: LITHIUM No results found for: VALPROATE No components found for:  CBMZ  Current Medications: Current Outpatient Medications  Medication Sig Dispense Refill  . ARIPiprazole (ABILIFY) 5 MG tablet Take 1 tablet (5 mg total) by mouth daily. 30 tablet 2  . atomoxetine (STRATTERA) 40 MG capsule Take 1 capsule (40 mg  total) by mouth daily. 30 capsule 2  . clindamycin (CLEOCIN) 300 MG capsule Take 300 mg by mouth every 6 (six) hours.    . DESCOVY 200-25 MG tablet Take 1 tablet by mouth daily.    Marland Kitchen FLUoxetine (PROZAC) 20 MG capsule Take 1 capsule (20 mg total) by mouth daily. 30 capsule 2  . gabapentin (NEURONTIN) 100 MG capsule Take 100 mg by mouth 3 (three) times daily.    Marland Kitchen ibuprofen (ADVIL) 800 MG tablet Take 1 tablet (800 mg total) by mouth 3 (three) times daily. 90 tablet 1  . ondansetron (ZOFRAN) 4 MG tablet Take 1 tablet (4 mg total) by mouth every 8 (eight) hours as needed for nausea or vomiting. 10 tablet 0  . oxyCODONE-acetaminophen (PERCOCET) 5-325 MG tablet Take 1 tablet by mouth every 6 (six) hours as needed for severe pain. 30 tablet 0  . traZODone (DESYREL) 50 MG tablet Take 1 tablet (50 mg total) by mouth at bedtime. 30 tablet 2   No current facility-administered medications for this visit.  Musculoskeletal: Strength & Muscle Tone: Unable to assess due to telehealth visit Gait & Station: Unable to assess due to telehealth visit Patient leans: N/A  Psychiatric Specialty Exam: Review of Systems  There were no vitals taken for this visit.There is no height or weight on file to calculate BMI.  General Appearance: Well Groomed  Eye Contact:  Good  Speech:  Clear and Coherent and Normal Rate  Volume:  Normal  Mood:  Anxious and Depressed  Affect:  Appropriate and Congruent  Thought Process:  Coherent, Goal Directed and Linear  Orientation:  Full (Time, Place, and Person)  Thought Content: Logical and Hallucinations: Auditory Visual   Suicidal Thoughts:  No  Homicidal Thoughts:  No  Memory:  Immediate;   Good Recent;   Good Remote;   Good  Judgement:  Good  Insight:  Good  Psychomotor Activity:  Normal  Concentration:  Concentration: Fair and Attention Span: Fair  Recall:  Good  Fund of Knowledge: Good  Language: Good  Akathisia:  No  Handed:  Right  AIMS (if indicated):  Note Done  Assets:  Communication Skills Desire for Improvement Financial Resources/Insurance Housing Social Support  ADL's:  Intact  Cognition: WNL  Sleep:  Good   Screenings: GAD-7     [facility]/19/[facility]  Total GAD-7 Score 18    PHQ2-9     Video Visit from 02/19/2020 in [facility]  PHQ-2 Total Score 1  PHQ-9 Total Score 16       Assessment and Plan Patient endorses symptoms of anxiety, depression, poor concentration, OCD, and VAH. He noted that he discontinued remeron because it "made me feel like a zombie" and start Prozac 20 mg to help manage symptoms of anxiety, depression, and OCD. He is also agreeable to start Strattera 40 mg to help manage symptoms of OCD. He will start Abilify 5 mg to help manage symptoms of psychosis. He will continue all other medications as prescribed.   1. Obsessive-compulsive personality disorder (HCC)  Start- FLUoxetine (PROZAC) 20 MG capsule; Take 1 capsule (20 mg total) by mouth daily.  Dispense: 30 capsule; Refill: 2  2. Attention deficit hyperactivity disorder (ADHD), predominantly inattentive type  Start- atomoxetine (STRATTERA) 40 MG capsule; Take 1 capsule (40 mg total) by mouth daily.  Dispense: 30 capsule; Refill: 2  3. Generalized anxiety disorder  Start- FLUoxetine (PROZAC) 20 MG capsule; Take 1 capsule (20 mg total) by mouth daily.  Dispense: 30 capsule; Refill: 2  4. 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HCC)  Start- FLUoxetine (PROZAC) 20 MG capsule; Take 1 capsule (20 mg total) by mouth daily.  Dispense: 30 capsule; Refill: 2 Start- ARIPiprazole (ABILIFY) 5 MG tablet; Take 1 tablet (5 mg total) by mouth daily.  Dispense: 30 tablet; Refill: 2 Continue- traZODone (DESYREL) 50 MG tablet; Take 1 tablet (50 mg total) by mouth at bedtime.  Dispense: 30 tablet; Refill: 2  Follow up in 3 months Follow up with therapy  Shanna Cisco,  NP 02/19/2020, 9:10 AM

## 2020-02-21 ENCOUNTER — Encounter: Payer: 59 | Admitting: Podiatry

## 2020-02-22 ENCOUNTER — Telehealth: Payer: Self-pay | Admitting: Podiatry

## 2020-02-22 NOTE — Telephone Encounter (Signed)
Patient would like to get a refill on his pain medications.

## 2020-02-23 ENCOUNTER — Telehealth: Payer: Self-pay | Admitting: Podiatry

## 2020-02-23 NOTE — Telephone Encounter (Signed)
Pt called for a second time requesting a refill on pain meds. Please advise.

## 2020-02-23 NOTE — Telephone Encounter (Signed)
Patient called in requesting refill for oxycodone, please advise 

## 2020-02-24 ENCOUNTER — Other Ambulatory Visit: Payer: Self-pay

## 2020-02-24 ENCOUNTER — Other Ambulatory Visit: Payer: Self-pay | Admitting: Podiatry

## 2020-02-24 ENCOUNTER — Ambulatory Visit (HOSPITAL_COMMUNITY)
Admission: RE | Admit: 2020-02-24 | Discharge: 2020-02-24 | Disposition: A | Discharge status: Home / Self Care | Payer: 59 | Source: Ambulatory Visit | Attending: Emergency Medicine | Admitting: Emergency Medicine

## 2020-02-24 ENCOUNTER — Ambulatory Visit (INDEPENDENT_AMBULATORY_CARE_PROVIDER_SITE_OTHER): Payer: 59

## 2020-02-24 ENCOUNTER — Encounter (HOSPITAL_COMMUNITY): Payer: Self-pay

## 2020-02-24 VITALS — BP 150/98 | HR 81 | Temp 98.7°F | Resp 18

## 2020-02-24 DIAGNOSIS — R030 Elevated blood-pressure reading, without diagnosis of hypertension: Secondary | ICD-10-CM

## 2020-02-24 DIAGNOSIS — M79672 Pain in left foot: Secondary | ICD-10-CM | POA: Diagnosis not present

## 2020-02-24 DIAGNOSIS — M79671 Pain in right foot: Secondary | ICD-10-CM

## 2020-02-24 NOTE — ED Provider Notes (Signed)
[facility]    CSN: [id number] Arrival date & time: 02/24/20  1446      History   Chief Complaint Chief Complaint  Patient presents with  . Appointment  . Foot Pain  . Fall    HPI Billy Mann is a 39 y.o. male.   Patient presents with bilateral foot pain since he tripped over his cat and fell on 02/22/2020.  He states he has pain in his toes on both feet and is concerned that he may have damaged his surgical sites.  Patient had bilateral hammertoe repair digits 2-5 on 01/25/2020; he had a follow-up visit with his podiatrist on 02/14/2020 and was noted to be doing well; his next appointment with his podiatrist is 02/28/2020; he states he was unable to get an appointment sooner.  He denies numbness, weakness, paresthesias, drainage from his surgical wounds, fever, or other symptoms.  His medical history also includes PTSD, obsessive-compulsive disorder, anxiety, severe depression with psychotic features, ADHD, tobacco abuse.  The history is provided by the patient and medical records.    Past Medical History:  Diagnosis Date  . High cholesterol     Patient Active Problem List   Diagnosis Date Noted  . Attention deficit hyperactivity disorder (ADHD), 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  . Generalized anxiety disorder 02/19/2020  . 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  . 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  . Post-traumatic stress disorder, unspecified 06/12/2019  . Colitis 06/30/2017  . Sepsis, unspecified organism (HCC) 06/30/2017  . Tobacco abuse 06/30/2017    History reviewed. No pertinent surgical history.     Home Medications    Prior to Admission medications   Medication Sig Start Date End Date Taking? Authorizing Provider  ARIPiprazole (ABILIFY) 5 MG tablet Take 1 tablet (5 mg total) by mouth daily. 02/19/20   Shanna Cisco, NP  atomoxetine (STRATTERA) 40 MG capsule Take 1 capsule (40 mg  total) by mouth daily. 02/19/20   Shanna Cisco, NP  clindamycin (CLEOCIN) 300 MG capsule Take 300 mg by mouth every 6 (six) hours. 10/11/19   [provider]  DESCOVY 200-25 MG tablet Take 1 tablet by mouth daily. 12/15/19   [provider]  FLUoxetine (PROZAC) 20 MG capsule Take 1 capsule (20 mg total) by mouth daily. 02/19/20   Shanna Cisco, NP  gabapentin (NEURONTIN) 100 MG capsule Take 100 mg by mouth 3 (three) times daily. 07/18/19   [provider]  ibuprofen (ADVIL) 800 MG tablet Take 1 tablet (800 mg total) by mouth 3 (three) times daily. 01/25/20   Felecia Shelling, DPM  ondansetron (ZOFRAN) 4 MG tablet Take 1 tablet (4 mg total) by mouth every 8 (eight) hours as needed for nausea or vomiting. 10/02/19   Tilden Fossa, MD  oxyCODONE-acetaminophen (PERCOCET) 5-325 MG tablet Take 1 tablet by mouth every 6 (six) hours as needed for severe pain. 02/14/20   Felecia Shelling, DPM  traZODone (DESYREL) 50 MG tablet Take 1 tablet (50 mg total) by mouth at bedtime. 02/19/20   Shanna Cisco, NP    Family History Family History  Family history unknown: Yes    Social History Social History   Tobacco Use  . Smoking status: Not on file  Substance Use Topics  . Alcohol use: Never  . Drug use: Yes    Types: Methamphetamines     Allergies   Penicillins   Review of Systems Review of Systems  Constitutional: Negative for chills and  fever.  HENT: Negative for ear pain and sore throat.   Eyes: Negative for pain and visual disturbance.  Respiratory: Negative for cough and shortness of breath.   Cardiovascular: Negative for chest pain and palpitations.  Gastrointestinal: Negative for abdominal pain and vomiting.  Genitourinary: Negative for dysuria and hematuria.  Musculoskeletal: Positive for arthralgias. Negative for back pain.  Skin: Negative for color change and rash.  Neurological: Negative for seizures, syncope, weakness and numbness.  All other  systems reviewed and are negative.    Physical Exam Triage Vital Signs ED Triage Vitals  Enc Vitals Group     BP      Pulse      Resp      Temp      Temp src      SpO2      Weight      Height      Head Circumference      Peak Flow      Pain Score      Pain Loc      Pain Edu?      Excl. in GC?    No data found.  Updated Vital Signs BP (!) 150/98 (BP Location: Right Arm)   Pulse 81   Temp 98.7 F (37.1 C) (Oral)   Resp 18   SpO2 96%   Visual Acuity Right Eye Distance:   Left Eye Distance:   Bilateral Distance:    Right Eye Near:   Left Eye Near:    Bilateral Near:     Physical Exam Vitals and nursing note reviewed.  Constitutional:      General: He is not in acute distress.    Appearance: He is well-developed.  HENT:     Head: Normocephalic and atraumatic.     Mouth/Throat:     Mouth: Mucous membranes are moist.  Eyes:     Conjunctiva/sclera: Conjunctivae normal.  Cardiovascular:     Rate and Rhythm: Normal rate and regular rhythm.     Heart sounds: No murmur heard.   Pulmonary:     Effort: Pulmonary effort is normal. No respiratory distress.     Breath sounds: Normal breath sounds.  Abdominal:     Palpations: Abdomen is soft.     Tenderness: There is no abdominal tenderness.  Musculoskeletal:        General: Swelling and tenderness present.     Cervical back: Neck supple.  Skin:    General: Skin is warm and dry.     Comments: Surgical incisions appear to be healing well. Mild edema in toes R>L.   Neurological:     General: No focal deficit present.     Mental Status: He is alert and oriented to person, place, and time.     Sensory: No sensory deficit.     Motor: No weakness.     Gait: Gait abnormal.  Psychiatric:        Mood and Affect: Mood normal.        Behavior: Behavior normal.          UC Treatments / Results  Labs (all labs ordered are listed, but only abnormal results are displayed) Labs Reviewed - No data to  display  EKG   Radiology DG Foot Complete Left  Result Date: 02/24/2020 CLINICAL DATA:  Pain after fall. Hammertoe surgery 1 month ago. Bilateral foot pain for 2 days after tripping over cat leading to fall. EXAM: LEFT FOOT - COMPLETE 3+ VIEW COMPARISON:  Radiograph 01/31/2020  FINDINGS: K-wire fixation of the second through fifth digits with proximal phalangeal osteotomies. K-wire alignment is unchanged from prior exam. No acute fracture or dislocation. No periosteal reaction or bony destruction. There is generalized soft tissue edema overlying the dorsum of the foot. IMPRESSION: 1. Soft tissue edema overlying the dorsum of the foot. No acute osseous abnormality. 2. K-wires within the second through fifth rays, unchanged in appearance from prior exam. Electronically Signed   By: Narda Rutherford M.D.   On: 02/24/2020 16:52   DG Foot Complete Right  Result Date: 02/24/2020 CLINICAL DATA:  Fall, hammertoe surgery 01/25/2020, bilateral foot pain EXAM: RIGHT FOOT COMPLETE - 3+ VIEW COMPARISON:  None. FINDINGS: Intact K-wires in the second through fourth rays terminating approximately at the level of the proximal metatarsals in the second through fourth rays at the level of the distal metatarsal in the fifth right. Resection of the distal heads of the proximal phalanges of the second through fifth toes. No fracture or dislocation. Soft tissue swelling throughout the mid to distal right foot. No significant arthropathy. No osseous erosions or periosteal reaction. IMPRESSION: Soft tissue swelling throughout the mid to distal right foot. No fracture or dislocation. Expected postsurgical changes from reported hammertoe surgical correction with K-wires in the second through fifth rays as detailed. Electronically Signed   By: Delbert Phenix M.D.   On: 02/24/2020 16:51    Procedures Procedures (including critical care time)  Medications Ordered in UC Medications - No data to display  Initial Impression /  Assessment and Plan / UC Course  I have reviewed the triage vital signs and the nursing notes.  Pertinent labs & imaging results that were available during my care of the patient were reviewed by me and considered in my medical decision making (see chart for details).   Bilateral foot pain. Elevated blood pressure reading.  X-rays of the right and left feet show soft tissue swelling; K-wires intact; no fractures. Instructed patient to follow-up with his podiatrist as scheduled on Wednesday. Discussed that his blood pressure is elevated today and needs to be rechecked by his PCP in 2 to 4 weeks. Patient agrees to plan of care.   Final Clinical Impressions(s) / UC Diagnoses   Final diagnoses:  Bilateral foot pain  Elevated blood pressure reading     Discharge Instructions     Follow up as scheduled with your podiatrist.    Your blood pressure is elevated today at 150/98.  Please have this rechecked by your primary care provider in 2-4 weeks.         ED Prescriptions    None     I have reviewed the PDMP during this encounter.   Mickie Bail, NP 02/24/20 1700

## 2020-02-24 NOTE — Discharge Instructions (Addendum)
Follow up as scheduled with your podiatrist.    Your blood pressure is elevated today at 150/98.  Please have this rechecked by your primary care provider in 2-4 weeks.

## 2020-02-24 NOTE — ED Triage Notes (Signed)
Pt presents with bilateral foot pain after a fall on Thursday morning; pt recently had surgery on both feet a few weeks ago.

## 2020-02-26 ENCOUNTER — Encounter (INDEPENDENT_AMBULATORY_CARE_PROVIDER_SITE_OTHER): Payer: Self-pay

## 2020-02-26 ENCOUNTER — Other Ambulatory Visit: Payer: Self-pay | Admitting: Podiatry

## 2020-02-26 MED ORDER — OXYCODONE-ACETAMINOPHEN 5-325 MG PO TABS: 1.0000 | ORAL_TABLET | Freq: Four times a day (QID) | ORAL | 0 refills | Status: DC | PRN

## 2020-02-26 NOTE — Progress Notes (Signed)
PRN pain postop 

## 2020-02-26 NOTE — Telephone Encounter (Signed)
Rx sent just now. - Dr. Logan Bores

## 2020-02-26 NOTE — Telephone Encounter (Signed)
Just sent Rx. - Dr. Logan Bores

## 2020-02-28 ENCOUNTER — Ambulatory Visit (INDEPENDENT_AMBULATORY_CARE_PROVIDER_SITE_OTHER): Payer: 59 | Admitting: Podiatry

## 2020-02-28 ENCOUNTER — Other Ambulatory Visit: Payer: Self-pay

## 2020-02-28 DIAGNOSIS — Z9889 Other specified postprocedural states: Secondary | ICD-10-CM

## 2020-02-28 DIAGNOSIS — M79671 Pain in right foot: Secondary | ICD-10-CM

## 2020-02-28 DIAGNOSIS — M2042 Other hammer toe(s) (acquired), left foot: Secondary | ICD-10-CM

## 2020-02-28 DIAGNOSIS — M2041 Other hammer toe(s) (acquired), right foot: Secondary | ICD-10-CM

## 2020-02-28 DIAGNOSIS — M79672 Pain in left foot: Secondary | ICD-10-CM

## 2020-02-28 NOTE — Progress Notes (Signed)
   Subjective:  Patient presents today status post hammertoe repair digits 2-5 bilateral. DOS: 01/25/2020.  Patient states that he did sustain a trip and fall injury with the emergency department since prior visit.  He states that he hurt the outside of his right foot.  He was evaluated at the ED and x-rays were taken.  He was informed that everything was normal and no damage occurred to the surgical foot.  He is feeling much better today.  Past Medical History:  Diagnosis Date  . High cholesterol       Objective/Physical Exam Neurovascular status intact.  Skin incisions appear to be well coapted with staples intact. No sign of infectious process noted. No dehiscence. No active bleeding noted. Moderate edema noted to the surgical extremity.   Assessment: 1. s/p hammertoe repair digits 2-5 bilateral. DOS: 01/25/2020   Plan of Care:  1. Patient was evaluated.   2.  Percutaneous fixation pins were removed today.  Dressings applied 3.  Continue postsurgical shoe x1 week.  After that he may begin to transition out of the postsurgical shoes into good supportive sneakers 4.  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 Felecia Shelling, DPM    [address] [place], [place] [address]                Office [phone]  Fax [phone]

## 2020-03-11 ENCOUNTER — Other Ambulatory Visit: Payer: Self-pay | Admitting: Podiatry

## 2020-03-11 NOTE — Telephone Encounter (Signed)
Please advise 

## 2020-03-12 MED ORDER — OXYCODONE-ACETAMINOPHEN 5-325 MG PO TABS: 1.0000 | ORAL_TABLET | Freq: Four times a day (QID) | ORAL | 0 refills | Status: DC | PRN

## 2020-03-20 ENCOUNTER — Other Ambulatory Visit: Payer: Self-pay | Admitting: Podiatry

## 2020-03-21 MED ORDER — OXYCODONE-ACETAMINOPHEN 5-325 MG PO TABS: 1.0000 | ORAL_TABLET | Freq: Four times a day (QID) | ORAL | 0 refills | Status: DC | PRN

## 2020-03-21 NOTE — Telephone Encounter (Signed)
Please advise 

## 2020-03-23 ENCOUNTER — Other Ambulatory Visit: Payer: Self-pay | Admitting: Podiatry

## 2020-03-25 NOTE — Telephone Encounter (Signed)
Please advise 

## 2020-03-27 ENCOUNTER — Telehealth: Payer: BLUE CROSS/BLUE SHIELD | Admitting: Internal Medicine

## 2020-04-02 ENCOUNTER — Other Ambulatory Visit: Payer: Self-pay | Admitting: Podiatry

## 2020-04-02 ENCOUNTER — Telehealth (INDEPENDENT_AMBULATORY_CARE_PROVIDER_SITE_OTHER): Payer: 59 | Admitting: Internal Medicine

## 2020-04-02 DIAGNOSIS — E785 Hyperlipidemia, unspecified: Secondary | ICD-10-CM | POA: Diagnosis not present

## 2020-04-02 DIAGNOSIS — Z7689 Persons encountering health services in other specified circumstances: Secondary | ICD-10-CM

## 2020-04-02 DIAGNOSIS — Z13228 Encounter for screening for other metabolic disorders: Secondary | ICD-10-CM

## 2020-04-02 DIAGNOSIS — Z1159 Encounter for screening for other viral diseases: Secondary | ICD-10-CM

## 2020-04-02 DIAGNOSIS — Z130 Encounter for screening for diseases of the blood and blood-forming organs and certain disorders involving the immune mechanism: Secondary | ICD-10-CM

## 2020-04-02 DIAGNOSIS — K219 Gastro-esophageal reflux disease without esophagitis: Secondary | ICD-10-CM

## 2020-04-02 MED ORDER — PANTOPRAZOLE SODIUM 40 MG PO TBEC: 40.0000 mg | DELAYED_RELEASE_TABLET | Freq: Every day | ORAL | 3 refills | Status: DC

## 2020-04-02 MED ORDER — OXYCODONE-ACETAMINOPHEN 5-325 MG PO TABS: 1.0000 | ORAL_TABLET | Freq: Three times a day (TID) | ORAL | 0 refills | Status: DC | PRN

## 2020-04-02 NOTE — Addendum Note (Signed)
Addended by: Heidi Dach on: 04/02/2020 03:39 PM   Modules accepted: Orders

## 2020-04-02 NOTE — Telephone Encounter (Signed)
Please advise 

## 2020-04-02 NOTE — Progress Notes (Signed)
Virtual Visit via Telephone Note  I connected with Kurt Hoffmeier, on 04/02/2020 at 3:28 PM by telephone due to the COVID-19 pandemic and verified that I am speaking with the correct person using two identifiers.   Consent: I discussed the limitations, risks, security and privacy concerns of performing an evaluation and management service by telephone and the availability of in person appointments. I also discussed with the patient that there may be a patient responsible charge related to this service. The patient expressed understanding and agreed to proceed.   Location of Patient: Home   Location of Provider: Clinic    Persons participating in Telemedicine visit: Dastan Krider [facility] Dr. Earlene Plater      History of Present Illness: Patient has a visit to establish care. PMH of Bipolar Disorder, ADHD, GAD---followed by psych.  In past has had HTN and HLD. Reports has been on medications but doesn't remember what it was.   Also has a h/o GERD. Has been chronic issue. Taking OTC medications without relief. Has been on PPI in the past. Has had an endoscopy in the past.    Past Medical History:  Diagnosis Date  . Anxiety    Phreesia 03/30/2020  . Depression    Phreesia 03/30/2020  . GERD (gastroesophageal reflux disease)    Phreesia 03/30/2020  . High cholesterol   . Hyperlipidemia    Phreesia 03/30/2020  . Hypertension    Phreesia 03/30/2020  . Sleep apnea    Phreesia 03/30/2020   Allergies  Allergen Reactions  . Penicillins Anaphylaxis    Current Outpatient Medications on File Prior to Visit  Medication Sig Dispense Refill  . ARIPiprazole (ABILIFY) 5 MG tablet Take 1 tablet (5 mg total) by mouth daily. 30 tablet 2  . atomoxetine (STRATTERA) 40 MG capsule Take 1 capsule (40 mg total) by mouth daily. 30 capsule 2  . emtricitabine-tenofovir (TRUVADA) 200-300 MG tablet Take 1 tablet by mouth daily.    Marland Kitchen FLUoxetine (PROZAC) 20 MG capsule Take 1 capsule (20  mg total) by mouth daily. 30 capsule 2  . ibuprofen (ADVIL) 800 MG tablet TAKE 1 TABLET(800 MG) BY MOUTH THREE TIMES DAILY 90 tablet 1  . traZODone (DESYREL) 50 MG tablet Take 1 tablet (50 mg total) by mouth at bedtime. 30 tablet 2   No current facility-administered medications on file prior to visit.    Observations/Objective: NAD. Speaking clearly.  Work of breathing normal.  Alert and oriented. Mood appropriate.   Assessment and Plan: 1. Encounter to establish care Reviewed patient's PMH, social history, surgical history, and medications.   2. Hyperlipidemia, unspecified hyperlipidemia type - Lipid Panel; Future  3. Screening for deficiency anemia - CBC with Differential; Future  4. Screening for metabolic disorder - Comprehensive metabolic panel; Future  5. Need for hepatitis C screening test - HCV Ab w/Rflx to Verification; Future  6. Gastroesophageal reflux disease, unspecified whether esophagitis present Patient to come for H. Pylori testing prior to starting PPI. Trial PPI. If no relief in 30 days, refer to GI.  - pantoprazole (PROTONIX) 40 MG tablet; Take 1 tablet (40 mg total) by mouth daily.  Dispense: 30 tablet; Refill: 3 - H. pylori Screen; Future    Follow Up Instructions: Lab visit 12/16   I discussed the assessment and treatment plan with the patient. The patient was provided an opportunity to ask questions and all were answered. The patient agreed with the plan and demonstrated an understanding of the instructions.   The patient was advised  to call back or seek an in-person evaluation if the symptoms worsen or if the condition fails to improve as anticipated.     I provided 8 minutes total of non-face-to-face time during this encounter including median intraservice time, reviewing previous notes, investigations, ordering medications, medical decision making, coordinating care and patient verbalized understanding at the end of the visit.    Marcy Siren, D.O. Primary Care at [facility]  04/02/2020, 3:28 PM

## 2020-04-03 ENCOUNTER — Encounter: Payer: Self-pay | Admitting: Podiatry

## 2020-04-03 ENCOUNTER — Other Ambulatory Visit: Payer: Self-pay

## 2020-04-03 ENCOUNTER — Ambulatory Visit (INDEPENDENT_AMBULATORY_CARE_PROVIDER_SITE_OTHER): Payer: 59 | Admitting: Podiatry

## 2020-04-03 DIAGNOSIS — Z9889 Other specified postprocedural states: Secondary | ICD-10-CM

## 2020-04-03 DIAGNOSIS — M79672 Pain in left foot: Secondary | ICD-10-CM

## 2020-04-03 DIAGNOSIS — M79671 Pain in right foot: Secondary | ICD-10-CM

## 2020-04-03 NOTE — Progress Notes (Signed)
   Subjective:  Patient presents today status post hammertoe repair digits 2-5 bilateral. DOS: 01/25/2020.  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  He does have some achiness and pain to his feet when working.  This is expected since he is only about 8 weeks after surgery.  Past Medical History:  Diagnosis Date  . Anxiety    Phreesia 03/30/2020  . Depression    Phreesia 03/30/2020  . GERD (gastroesophageal reflux disease)    Phreesia 03/30/2020  . High cholesterol   . Hyperlipidemia    Phreesia 03/30/2020  . Hypertension    Phreesia 03/30/2020  . Sleep apnea    Phreesia 03/30/2020      Objective/Physical Exam Neurovascular status intact.  Skin incisions appear to be well coapted and healed. No sign of infectious process noted. No dehiscence. No active bleeding noted. Moderate edema noted to the surgical extremity which is expected due to the patient's increased activities over the past few weeks.   Assessment: 1. s/p hammertoe repair digits 2-5 bilateral. DOS: 01/25/2020   Plan of Care:  1. Patient was evaluated.   2.  Patient may now resume full activity no restrictions.  He is doing very well. 3.  Recommend good supportive shoes 4.  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 Felecia Shelling, DPM    [address] [place], [place] [address]                Office [phone]  Fax [phone]

## 2020-04-04 ENCOUNTER — Other Ambulatory Visit (INDEPENDENT_AMBULATORY_CARE_PROVIDER_SITE_OTHER): Payer: 59

## 2020-04-04 DIAGNOSIS — Z13228 Encounter for screening for other metabolic disorders: Secondary | ICD-10-CM

## 2020-04-04 DIAGNOSIS — Z1159 Encounter for screening for other viral diseases: Secondary | ICD-10-CM | POA: Diagnosis not present

## 2020-04-04 DIAGNOSIS — E785 Hyperlipidemia, unspecified: Secondary | ICD-10-CM

## 2020-04-04 DIAGNOSIS — Z130 Encounter for screening for diseases of the blood and blood-forming organs and certain disorders involving the immune mechanism: Secondary | ICD-10-CM

## 2020-04-05 LAB — LIPID PANEL
Chol/HDL Ratio: 7.6 ratio — ABNORMAL HIGH (ref 0.0–5.0)
Cholesterol, Total: 283 mg/dL — ABNORMAL HIGH (ref 100–199)
HDL: 37 mg/dL — ABNORMAL LOW (ref >39)
LDL Chol Calc (NIH): 160 mg/dL — ABNORMAL HIGH (ref 0–99)
Triglycerides: 447 mg/dL — ABNORMAL HIGH (ref 0–149)
VLDL Cholesterol Cal: 86 mg/dL — ABNORMAL HIGH (ref 5–40)

## 2020-04-05 LAB — COMPREHENSIVE METABOLIC PANEL WITH GFR
ALT: 43 [IU]/L (ref 0–44)
AST: 24 [IU]/L (ref 0–40)
Albumin/Globulin Ratio: 1.7 (ref 1.2–2.2)
Albumin: 4.2 g/dL (ref 4.0–5.0)
Alkaline Phosphatase: 87 [IU]/L (ref 44–121)
BUN/Creatinine Ratio: 11 (ref 9–20)
BUN: 13 mg/dL (ref 6–20)
Bilirubin Total: 0.3 mg/dL (ref 0.0–1.2)
CO2: 25 mmol/L (ref 20–29)
Calcium: 9.2 mg/dL (ref 8.7–10.2)
Chloride: 105 mmol/L (ref 96–106)
Creatinine, Ser: 1.17 mg/dL (ref 0.76–1.27)
GFR calc Af Amer: 90 mL/min/{1.73_m2}
GFR calc non Af Amer: 78 mL/min/{1.73_m2}
Globulin, Total: 2.5 g/dL (ref 1.5–4.5)
Glucose: 104 mg/dL — ABNORMAL HIGH (ref 65–99)
Potassium: 4.6 mmol/L (ref 3.5–5.2)
Sodium: 142 mmol/L (ref 134–144)
Total Protein: 6.7 g/dL (ref 6.0–8.5)

## 2020-04-05 LAB — CBC WITH DIFFERENTIAL/PLATELET
Basophils Absolute: 0.1 10*3/uL (ref 0.0–0.2)
Basos: 1 %
EOS (ABSOLUTE): 0.2 10*3/uL (ref 0.0–0.4)
Eos: 3 %
Hematocrit: 45.2 % (ref 37.5–51.0)
Hemoglobin: 15.8 g/dL (ref 13.0–17.7)
Immature Grans (Abs): 0.1 10*3/uL (ref 0.0–0.1)
Immature Granulocytes: 2 %
Lymphocytes Absolute: 2.3 10*3/uL (ref 0.7–3.1)
Lymphs: 32 %
MCH: 33.2 pg — ABNORMAL HIGH (ref 26.6–33.0)
MCHC: 35 g/dL (ref 31.5–35.7)
MCV: 95 fL (ref 79–97)
Monocytes Absolute: 0.6 10*3/uL (ref 0.1–0.9)
Monocytes: 8 %
Neutrophils Absolute: 4 10*3/uL (ref 1.4–7.0)
Neutrophils: 54 %
Platelets: 217 10*3/uL (ref 150–450)
RBC: 4.76 x10E6/uL (ref 4.14–5.80)
RDW: 12.9 % (ref 11.6–15.4)
WBC: 7.2 10*3/uL (ref 3.4–10.8)

## 2020-04-05 LAB — HCV INTERPRETATION

## 2020-04-05 LAB — HCV AB W/RFLX TO VERIFICATION: HCV Ab: <0.1 {s_co_ratio} (ref 0.0–0.9)

## 2020-04-05 LAB — H. PYLORI BREATH TEST: H pylori Breath Test: NEGATIVE

## 2020-04-09 ENCOUNTER — Other Ambulatory Visit: Payer: Self-pay | Admitting: Internal Medicine

## 2020-04-09 ENCOUNTER — Other Ambulatory Visit: Payer: Self-pay | Admitting: Podiatry

## 2020-04-09 DIAGNOSIS — E782 Mixed hyperlipidemia: Secondary | ICD-10-CM | POA: Insufficient documentation

## 2020-04-09 MED ORDER — ATORVASTATIN CALCIUM 20 MG PO TABS: 20.0000 mg | ORAL_TABLET | Freq: Every day | ORAL | 3 refills | Status: DC

## 2020-04-09 NOTE — Progress Notes (Signed)
Patient notified of results & recommendations. Expressed understanding.

## 2020-04-10 NOTE — Telephone Encounter (Signed)
Please advise 

## 2020-04-12 ENCOUNTER — Telehealth: Payer: 59 | Admitting: Nurse Practitioner

## 2020-04-12 DIAGNOSIS — K0889 Other specified disorders of teeth and supporting structures: Secondary | ICD-10-CM | POA: Diagnosis not present

## 2020-04-12 MED ORDER — CLINDAMYCIN HCL 300 MG PO CAPS: 300.0000 mg | ORAL_CAPSULE | Freq: Four times a day (QID) | ORAL | 0 refills | Status: DC

## 2020-04-12 MED ORDER — NAPROXEN 500 MG PO TABS: 500.0000 mg | ORAL_TABLET | Freq: Two times a day (BID) | ORAL | 1 refills | Status: DC

## 2020-04-12 NOTE — Progress Notes (Signed)
E-Visit for Dental Pain  We are sorry that you are not feeling well.  Here is how we plan to help!  Based on what you have shared with me in the questionnaire, it sounds like you have dental fracture  naprosyn 500mg  2 times per day for 7 days for discomfort and Clindamycin 300mg  4 times per day for days  It is imperative that you see a dentist within 10 days of this eVisit to determine the cause of the dental pain and be sure it is adequately treated  A toothache or tooth pain is caused when the nerve in the root of a tooth or surrounding a tooth is irritated. Dental (tooth) infection, decay, injury, or loss of a tooth are the most common causes of dental pain. Pain may also occur after an extraction (tooth is pulled out). Pain sometimes originates from other areas and radiates to the jaw, thus appearing to be tooth pain.Bacteria growing inside your mouth can contribute to gum disease and dental decay, both of which can cause pain. A toothache occurs from inflammation of the central portion of the tooth called pulp. The pulp contains nerve endings that are very sensitive to pain. Inflammation to the pulp or pulpitis may be caused by dental cavities, trauma, and infection.    HOME CARE:   For toothaches: . Over-the-counter pain medications such as acetaminophen or ibuprofen may be used. Take these as directed on the package while you arrange for a dental appointment. . Avoid very cold or hot foods, because they may make the pain worse. . You may get relief from biting on a cotton ball soaked in oil of cloves. You can get oil of cloves at most drug stores.  For jaw pain: .  Aspirin may be helpful for problems in the joint of the jaw in adults. . If pain happens every time you open your mouth widely, the temporomandibular joint (TMJ) may be the source of the pain. Yawning or taking a large bite of food may worsen the pain. An appointment with your doctor or dentist will help you find the cause.      GET HELP RIGHT AWAY IF:  . You have a high fever or chills . If you have had a recent head or face injury and develop headache, light headedness, nausea, vomiting, or other symptoms that concern you after an injury to your face or mouth, you could have a more serious injury in addition to your dental injury. . A facial rash associated with a toothache: This condition may improve with medication. Contact your doctor for them to decide what is appropriate. . Any jaw pain occurring with chest pain: Although jaw pain is most commonly caused by dental disease, it is sometimes referred pain from other areas. People with heart disease, especially people who have had stents placed, people with diabetes, or those who have had heart surgery may have jaw pain as a symptom of heart attack or angina. If your jaw or tooth pain is associated with lightheadedness, sweating, or shortness of breath, you should see a doctor as soon as possible. . Trouble swallowing or excessive pain or bleeding from gums: If you have a history of a weakened immune system, diabetes, or steroid use, you may be more susceptible to infections. Infections can often be more severe and extensive or caused by unusual organisms. Dental and gum infections in people with these conditions may require more aggressive treatment. An abscess may need draining or IV antibiotics, for example.  MAKE SURE YOU    Understand these instructions.  Will watch your condition.  Will get help right away if you are not doing well or get worse.  Thank you for choosing an e-visit. Your e-visit answers were reviewed by a board certified advanced clinical practitioner to complete your personal care plan. Depending upon the condition, your plan could have included both over the counter or prescription medications. Please review your pharmacy choice. Make sure the pharmacy is open so you can pick up prescription now. If there is a problem, you may contact your  provider through [employer] and have the prescription routed to another pharmacy. Your safety is important to [place]. If you have drug allergies check your prescription carefully.  For the next 24 hours you can use MyChart to ask questions about today's visit, request a non-urgent call back, or ask for a work or school excuse. You will get an email in the next two days asking about your experience. I hope that your e-visit has been valuable and will speed your recovery.  5-10 minutes spent reviewing and documenting in chart.

## 2020-04-15 ENCOUNTER — Other Ambulatory Visit: Payer: Self-pay | Admitting: Podiatry

## 2020-04-15 NOTE — Telephone Encounter (Signed)
Please Advise

## 2020-04-21 ENCOUNTER — Telehealth: Payer: 59 | Admitting: Family

## 2020-04-21 DIAGNOSIS — R6889 Other general symptoms and signs: Secondary | ICD-10-CM

## 2020-04-21 MED ORDER — FLUTICASONE PROPIONATE 50 MCG/ACT NA SUSP: 2.0000 | Freq: Every day | NASAL | 6 refills | Status: DC

## 2020-04-21 MED ORDER — PREDNISONE 10 MG (21) PO TBPK: ORAL_TABLET | ORAL | 0 refills | Status: DC

## 2020-04-21 NOTE — Progress Notes (Signed)
E visit for Flu like symptoms   We are sorry that you are not feeling well.  Here is how we plan to help! Based on what you have shared with me it looks like you may have a respiratory virus that may be influenza.  Influenza or "the flu" is   an infection caused by a respiratory virus. The flu virus is highly contagious and persons who did not receive their yearly flu vaccination may "catch" the flu from close contact.  We have anti-viral medications to treat the viruses that cause this infection. They are not a "cure" and only shorten the course of the infection. These prescriptions are most effective when they are given within the first 2 days of "flu" symptoms. Antiviral medication are indicated if you have a high risk of complications from the flu. You should  also consider an antiviral medication if you are in close contact with someone who is at risk. These medications can help patients avoid complications from the flu  but have side effects that you should know. Possible side effects from Tamiflu or oseltamivir include nausea, vomiting, diarrhea, dizziness, headaches, eye redness, sleep problems or other respiratory symptoms. You should not take Tamiflu if you have an allergy to oseltamivir or any to the ingredients in Tamiflu.  Based upon your symptoms and potential risk factors I have prescribed flonase and prednisone dose pack.   ANYONE WHO HAS FLU SYMPTOMS SHOULD: . Stay home. The flu is highly contagious and going out or to work exposes others! . Be sure to drink plenty of fluids. Water is fine as well as fruit juices, sodas and electrolyte beverages. You may want to stay away from caffeine or alcohol. If you are nauseated, try taking small sips of liquids. How do you know if you are getting enough fluid? Your urine should be a pale yellow or almost colorless. . Get rest. . Taking a steamy shower or using a humidifier may help nasal congestion and ease sore throat pain. Using a saline  nasal spray works much the same way. . Cough drops, hard candies and sore throat lozenges may ease your cough. . Line up a caregiver. Have someone check on you regularly.   GET HELP RIGHT AWAY IF: . You cannot keep down liquids or your medications. . You become short of breath . Your fell like you are going to pass out or loose consciousness. . Your symptoms persist after you have completed your treatment plan MAKE SURE YOU   Understand these instructions.  Will watch your condition.  Will get help right away if you are not doing well or get worse.  Your e-visit answers were reviewed by a board certified advanced clinical practitioner to complete your personal care plan.  Depending on the condition, your plan could have included both over the counter or prescription medications.  If there is a problem please reply  once you have received a response from your provider.  Your safety is important to [place].  If you have drug allergies check your prescription carefully.    You can use MyChart to ask questions about today's visit, request a non-urgent call back, or ask for a work or school excuse for 24 hours related to this e-Visit. If it has been greater than 24 hours you will need to follow up with your provider, or enter a new e-Visit to address those concerns.  You will get an e-mail in the next two days asking about your experience.  I hope  that your e-visit has been valuable and will speed your recovery. Thank you for using e-visits.  Approximately 5 minutes was spent documenting and reviewing patient's chart.    

## 2020-04-25 ENCOUNTER — Other Ambulatory Visit: Payer: Self-pay | Admitting: Podiatry

## 2020-04-27 ENCOUNTER — Other Ambulatory Visit: Payer: Self-pay

## 2020-04-27 ENCOUNTER — Emergency Department (HOSPITAL_COMMUNITY): Payer: Self-pay

## 2020-04-27 ENCOUNTER — Emergency Department (HOSPITAL_COMMUNITY)
Admission: EM | Admit: 2020-04-27 | Discharge: 2020-04-28 | Disposition: A | Discharge status: Home / Self Care | Payer: Self-pay | Attending: Emergency Medicine | Admitting: Emergency Medicine

## 2020-04-27 DIAGNOSIS — R4182 Altered mental status, unspecified: Secondary | ICD-10-CM | POA: Insufficient documentation

## 2020-04-27 DIAGNOSIS — F1910 Other psychoactive substance abuse, uncomplicated: Secondary | ICD-10-CM

## 2020-04-27 DIAGNOSIS — I10 Essential (primary) hypertension: Secondary | ICD-10-CM | POA: Insufficient documentation

## 2020-04-27 DIAGNOSIS — F909 Attention-deficit hyperactivity disorder, unspecified type: Secondary | ICD-10-CM | POA: Insufficient documentation

## 2020-04-27 DIAGNOSIS — F332 Major depressive disorder, recurrent severe without psychotic features: Secondary | ICD-10-CM | POA: Insufficient documentation

## 2020-04-27 DIAGNOSIS — Z79899 Other long term (current) drug therapy: Secondary | ICD-10-CM | POA: Insufficient documentation

## 2020-04-27 LAB — COMPREHENSIVE METABOLIC PANEL
ALT: 40 U/L (ref 0–44)
AST: 35 U/L (ref 15–41)
Albumin: 3.7 g/dL (ref 3.5–5.0)
Alkaline Phosphatase: 54 U/L (ref 38–126)
Anion gap: 11 (ref 5–15)
BUN: 15 mg/dL (ref 6–20)
CO2: 18 mmol/L — ABNORMAL LOW (ref 22–32)
Calcium: 7.7 mg/dL — ABNORMAL LOW (ref 8.9–10.3)
Chloride: 109 mmol/L (ref 98–111)
Creatinine, Ser: 1.04 mg/dL (ref 0.61–1.24)
GFR, Estimated: >60 mL/min (ref >60)
Glucose, Bld: 92 mg/dL (ref 70–99)
Potassium: 3.1 mmol/L — ABNORMAL LOW (ref 3.5–5.1)
Sodium: 138 mmol/L (ref 135–145)
Total Bilirubin: 1.7 mg/dL — ABNORMAL HIGH (ref 0.3–1.2)
Total Protein: 5.7 g/dL — ABNORMAL LOW (ref 6.5–8.1)

## 2020-04-27 LAB — CBC WITH DIFFERENTIAL/PLATELET
Abs Immature Granulocytes: 0.07 10*3/uL (ref 0.00–0.07)
Basophils Absolute: 0.1 10*3/uL (ref 0.0–0.1)
Basophils Relative: 0 %
Eosinophils Absolute: 0.2 10*3/uL (ref 0.0–0.5)
Eosinophils Relative: 1 %
HCT: 42.3 % (ref 39.0–52.0)
Hemoglobin: 15.4 g/dL (ref 13.0–17.0)
Immature Granulocytes: 1 %
Lymphocytes Relative: 18 %
Lymphs Abs: 2.4 10*3/uL (ref 0.7–4.0)
MCH: 33 pg (ref 26.0–34.0)
MCHC: 36.4 g/dL — ABNORMAL HIGH (ref 30.0–36.0)
MCV: 90.8 fL (ref 80.0–100.0)
Monocytes Absolute: 1.3 10*3/uL — ABNORMAL HIGH (ref 0.1–1.0)
Monocytes Relative: 10 %
Neutro Abs: 9.5 10*3/uL — ABNORMAL HIGH (ref 1.7–7.7)
Neutrophils Relative %: 70 %
Platelets: 215 10*3/uL (ref 150–400)
RBC: 4.66 MIL/uL (ref 4.22–5.81)
RDW: 11.9 % (ref 11.5–15.5)
WBC: 13.5 10*3/uL — ABNORMAL HIGH (ref 4.0–10.5)
nRBC: 0 % (ref 0.0–0.2)

## 2020-04-27 LAB — SALICYLATE LEVEL: Salicylate Lvl: <7 mg/dL — ABNORMAL LOW (ref 7.0–30.0)

## 2020-04-27 LAB — CBG MONITORING, ED: Glucose-Capillary: 105 mg/dL — ABNORMAL HIGH (ref 70–99)

## 2020-04-27 LAB — ETHANOL: Alcohol, Ethyl (B): <10 mg/dL (ref <10)

## 2020-04-27 LAB — ACETAMINOPHEN LEVEL: Acetaminophen (Tylenol), Serum: <10 ug/mL — ABNORMAL LOW (ref 10–30)

## 2020-04-27 NOTE — ED Notes (Signed)
Pt medicated by EMS in route to ED with haldol and versed for agitation and anxiety pt obtunded on arrival
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
## 2020-04-27 NOTE — ED Triage Notes (Signed)
Pt partner called EMS due to agitation, last meth use was three days ago, HX of substance abuse and mental illness. EMS gave haldol and versed to calm pt down

## 2020-04-28 MED ORDER — SODIUM CHLORIDE 0.9 % IV BOLUS
1000.0000 mL | Freq: Once | INTRAVENOUS | Status: AC
  Administered 2020-04-28: 1000 mL via INTRAVENOUS

## 2020-04-28 NOTE — ED Provider Notes (Signed)
2:33 AM Patient is alert and oriented.  States that he used crystal meth.  Denies SI/HI.  Noted to be tachycardic and hypokalemic.  Fluids and potassium ordered.  4:02 AM Vitals:   04/28/20 0200 04/28/20 0330  BP: (!) 149/110 (!) 142/101  Pulse: 92 85  Resp: 18 18  Temp:    SpO2: 98% 98%    Ambulates without difficulty.  States he is ready for discharge.   Roxy Horseman, PA-C 04/28/20 0403    Marily Memos, MD 04/28/20 [phone]

## 2020-04-28 NOTE — Telephone Encounter (Signed)
Please advise 

## 2020-05-16 ENCOUNTER — Other Ambulatory Visit: Payer: Self-pay

## 2020-05-16 ENCOUNTER — Telehealth (HOSPITAL_COMMUNITY): Payer: 59 | Admitting: Psychiatry

## 2020-05-20 ENCOUNTER — Other Ambulatory Visit: Payer: Self-pay

## 2020-05-20 ENCOUNTER — Telehealth (INDEPENDENT_AMBULATORY_CARE_PROVIDER_SITE_OTHER): Payer: No Payment, Other | Admitting: Psychiatry

## 2020-05-20 ENCOUNTER — Encounter (HOSPITAL_COMMUNITY): Payer: Self-pay | Admitting: Psychiatry

## 2020-05-20 DIAGNOSIS — F411 Generalized anxiety disorder: Secondary | ICD-10-CM

## 2020-05-20 DIAGNOSIS — F900 Attention-deficit hyperactivity disorder, predominantly inattentive type: Secondary | ICD-10-CM | POA: Diagnosis not present

## 2020-05-20 DIAGNOSIS — F323 Major depressive disorder, single episode, severe with psychotic features: Secondary | ICD-10-CM | POA: Diagnosis not present

## 2020-05-20 MED ORDER — ATOMOXETINE HCL 80 MG PO CAPS: 80.0000 mg | ORAL_CAPSULE | Freq: Every day | ORAL | 2 refills | Status: DC

## 2020-05-20 MED ORDER — QUETIAPINE FUMARATE 100 MG PO TABS: 100.0000 mg | ORAL_TABLET | Freq: Every day | ORAL | 2 refills | Status: DC

## 2020-05-20 MED ORDER — BUSPIRONE HCL 10 MG PO TABS: 10.0000 mg | ORAL_TABLET | Freq: Three times a day (TID) | ORAL | 2 refills | Status: DC

## 2020-05-20 MED ORDER — TRAZODONE HCL 50 MG PO TABS: 50.0000 mg | ORAL_TABLET | Freq: Every day | ORAL | 2 refills | Status: DC

## 2020-05-20 NOTE — Progress Notes (Signed)
BH MD/PA/NP OP Progress Note Virtual Visit via Video Note  I connected with Billy Mann on 05/20/20 at  8:30 AM EST by a video enabled telemedicine application and verified that I am speaking with the correct person using two identifiers.  Location: Patient: Home Provider: Clinic   I discussed the limitations of evaluation and management by telemedicine and the availability of in person appointments. The patient expressed understanding and agreed to proceed.  I provided 30 minutes of non-face-to-face time during this encounter.    05/20/2020 9:45 AM Billy Mann  MRN:  [id number]  Chief Complaint:  "I stopped the Abilify because it worsened my VAH"   "HPI: 40 year old male seen today for follow up psychiatric evaluation.  He has a psychiatric history PTSD, bipolar disorder, alcohol use disorder (in remission), cannabis use disorder (in remission), depression, ADHD, and OCD.  He is currently being managed on trazodone 50 mg, Prozac 20 mg,  and Abilify 5 mg. He informed provider that he discontinued his Abilify because it caused increased VAH.  Today well-groomed, pleasant, cooperative, engaged in conversation, and maintained eye contact.  He informed provider that he has been more anxious and depressed.  Provider conducted a GAD-7 and patient scored an 20, at last visit he scored an  18.  Provider also conducted a PHQ-9 and patient scored a 20.  Patient notes that since stopping his Abilify he continues to have auditory and visual hallucinations.  He notes that he see flashes of light and hears people call his name.  Patient also informed provider that he is having sexual side effects.  He notes that he has low libido and is unable to maintain sexual intercourse.  Provider informed patient that Prozac may be the cause of his sexual side effects.  Patient also endorses symptoms of ADHD such as poor concentration, avoidance of mentally taxing task, disorganization, and forgetfulness.  He  notes that these problems are interfering with working.  He informed provider that he has a simple job but is unable to focus at work.    Patient agreeable to discontinue Prozac 20 mg due to its sexual side effects (he will cut his pill in half for a week and then discontinue).  Provider discussed with patient that if sexual side effects does not improve after discontinuation of Prozac he should follow-up with his primary care provider for further evaluation.  Patient has already discontinued Abilify and he will start Seroquel 100 mg to help manage depression, psychosis, and mood.  He will also start BuSpar 10 mg 3 times daily to help manage anxiety.  Patient is also agreeable to increasing Strattera 40 mg to 80 mg to help manage symptoms of ADHD.  Potential side effects of medication and risks vs benefits of treatment vs non-treatment were explained and discussed. All questions were answered. He will continue all other medications as prescribed.  He will follow up with outpatient counseling for therapy.  No other concerns noted at this time.  Visit Diagnosis:    ICD-10-CM   1. Attention deficit hyperactivity disorder (ADHD), predominantly inattentive type  F90.0 atomoxetine (STRATTERA) 80 MG capsule  2. Generalized anxiety disorder  F41.1 busPIRone (BUSPAR) 10 MG tablet    QUEtiapine (SEROQUEL) 100 MG tablet  3. 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HCC)  F32.3 traZODone (DESYREL) 50 MG tablet    busPIRone (BUSPAR) 10 MG tablet    QUEtiapine (SEROQUEL) 100 MG tablet    Past Psychiatric History: PTSD, bipolar disorder, alcohol use disorder, cannabis use  disorder, OCD, and depression Past Medical History:  Past Medical History:  Diagnosis Date  . Anxiety    Phreesia 03/30/2020  . Depression    Phreesia 03/30/2020  . GERD (gastroesophageal reflux disease)    Phreesia 03/30/2020  . High cholesterol   . Hyperlipidemia    Phreesia 03/30/2020  . Hypertension    Phreesia 03/30/2020   . Sleep apnea    Phreesia 03/30/2020   History reviewed. No pertinent surgical history.  Family Psychiatric History: Patient adopted and does not know biological family history  Family History:  Family History  Family history unknown: Yes    Social History:  Social History   Socioeconomic History  . Marital status: Single    Spouse name: Not on file  . Number of children: Not on file  . Years of education: Not on file  . Highest education level: Not on file  Occupational History  . Not on file  Tobacco Use  . Smoking status: Not on file  . Smokeless tobacco: Not on file  Substance and Sexual Activity  . Alcohol use: Never  . Drug use: Yes    Types: Methamphetamines  . Sexual activity: Not on file  Other Topics Concern  . Not on file  Social History Narrative  . Not on file   Social Determinants of Health   Financial Resource Strain: Not on file  Food Insecurity: Not on file  Transportation Needs: Not on file  Physical Activity: Not on file  Stress: Not on file  Social Connections: Not on file    Allergies:  Allergies  Allergen Reactions  . Penicillins Anaphylaxis    Metabolic Disorder Labs: No results found for: HGBA1C, MPG No results found for: PROLACTIN Lab Results  Component Value Date   CHOL 283 (H) 04/04/2020   TRIG 447 (H) 04/04/2020   HDL 37 (L) 04/04/2020   CHOLHDL 7.6 (H) 04/04/2020   LDLCALC 160 (H) 04/04/2020   No results found for: TSH  Therapeutic Level Labs: No results found for: LITHIUM No results found for: VALPROATE No components found for:  CBMZ  Current Medications: Current Outpatient Medications  Medication Sig Dispense Refill  . busPIRone (BUSPAR) 10 MG tablet Take 1 tablet (10 mg total) by mouth 3 (three) times daily. 90 tablet 2  . QUEtiapine (SEROQUEL) 100 MG tablet Take 1 tablet (100 mg total) by mouth at bedtime. 30 tablet 2  . atomoxetine (STRATTERA) 80 MG capsule Take 1 capsule (80 mg total) by mouth daily. 30  capsule 2  . atorvastatin (LIPITOR) 20 MG tablet Take 1 tablet (20 mg total) by mouth daily. 90 tablet 3  . clindamycin (CLEOCIN) 300 MG capsule Take 1 capsule (300 mg total) by mouth 4 (four) times daily. 40 capsule 0  . emtricitabine-tenofovir (TRUVADA) 200-300 MG tablet Take 1 tablet by mouth daily.    . fluticasone (FLONASE) 50 MCG/ACT nasal spray Place 2 sprays into both nostrils daily. 16 g 6  . ibuprofen (ADVIL) 800 MG tablet TAKE 1 TABLET(800 MG) BY MOUTH THREE TIMES DAILY 90 tablet 1  . naproxen (NAPROSYN) 500 MG tablet Take 1 tablet (500 mg total) by mouth 2 (two) times daily with a meal. 60 tablet 1  . oxyCODONE-acetaminophen (PERCOCET) 5-325 MG tablet Take 1 tablet by mouth every 8 (eight) hours as needed for severe pain. 21 tablet 0  . pantoprazole (PROTONIX) 40 MG tablet Take 1 tablet (40 mg total) by mouth daily. 30 tablet 3  . predniSONE (STERAPRED UNI-PAK 21 TAB)  10 MG (21) TBPK tablet Use as directed 21 tablet 0  . traZODone (DESYREL) 50 MG tablet Take 1 tablet (50 mg total) by mouth at bedtime. 30 tablet 2   No current facility-administered medications for this visit.     Musculoskeletal: Strength & Muscle Tone: Unable to assess due to telehealth visit Gait & Station: Unable to assess due to telehealth visit Patient leans: N/A  Psychiatric Specialty Exam: Review of Systems  There were no vitals taken for this visit.There is no height or weight on file to calculate BMI.  General Appearance: Well Groomed  Eye Contact:  Good  Speech:  Clear and Coherent and Normal Rate  Volume:  Normal  Mood:  Anxious and Depressed  Affect:  Appropriate and Congruent  Thought Process:  Coherent, Goal Directed and Linear  Orientation:  Full (Time, Place, and Person)  Thought Content: Logical and Hallucinations: Auditory Visual   Suicidal Thoughts:  No  Homicidal Thoughts:  No  Memory:  Immediate;   Good Recent;   Good Remote;   Good  Judgement:  Good  Insight:  Good   Psychomotor Activity:  Normal  Concentration:  Concentration: Fair and Attention Span: Fair  Recall:  Good  Fund of Knowledge: Good  Language: Good  Akathisia:  No  Handed:  Right  AIMS (if indicated): Note Done  Assets:  Communication Skills Desire for Improvement Financial Resources/Insurance Housing Social Support  ADL's:  Intact  Cognition: WNL  Sleep:  Good   Screenings: GAD-7   Flowsheet Row Video Visit from [facility]/20/2020 in [facility] Video Visit from 02/19/2020 in [facility]  Total GAD-7 Score 21 18    PHQ2-9   Flowsheet Row Video Visit from 05/20/2020 in [facility] Video Visit from 02/19/2020 in [facility]  PHQ-2 Total Score 5 1  PHQ-9 Total Score 20 16       Assessment and Plan Patient endorses symptoms of anxiety, depression, VAH, and sexual side effects. Patient agreeable to discontinue Prozac 20 mg due to its sexual side effects (he will cut his pill in half for a week and then discontinue).  Patient has already discontinued Abilify and he will start Seroquel 100 mg to help manage depression, psychosis, and mood.  He will also start BuSpar 10 mg 3 times daily to help manage anxiety.  Patient is also agreeable to increasing Strattera 40 mg to 80 mg to help manage symptoms of ADHD.  Potential side effects of medication and risks vs benefits of treatment vs non-treatment were explained and discussed.   1. Attention deficit hyperactivity disorder (ADHD), predominantly inattentive type  Increased- atomoxetine (STRATTERA) 80 MG capsule; Take 1 capsule (80 mg total) by mouth daily.  Dispense: 30 capsule; Refill: 2  2. Generalized anxiety disorder  Start- busPIRone (BUSPAR) 10 MG tablet; Take 1 tablet (10 mg total) by mouth 3 (three) times daily.  Dispense: 90 tablet; Refill: 2 Start- QUEtiapine (SEROQUEL) 100 MG tablet; Take 1 tablet (100 mg total) by mouth  at bedtime.  Dispense: 30 tablet; Refill: 2  3. 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HCC)  Continue- traZODone (DESYREL) 50 MG tablet; Take 1 tablet (50 mg total) by mouth at bedtime.  Dispense: 30 tablet; Refill: 2 Start- busPIRone (BUSPAR) 10 MG tablet; Take 1 tablet (10 mg total) by mouth 3 (three) times daily.  Dispense: 90 tablet; Refill: 2 Start- QUEtiapine (SEROQUEL) 100 MG tablet; Take 1 tablet (100 mg total)  by mouth at bedtime.  Dispense: 30 tablet; Refill: 2     Follow up in 3 months Follow up with therapy  Shanna Cisco, NP 05/20/2020, 9:45 AM

## 2020-05-21 ENCOUNTER — Encounter: Payer: Self-pay | Admitting: Internal Medicine

## 2020-05-21 ENCOUNTER — Telehealth (INDEPENDENT_AMBULATORY_CARE_PROVIDER_SITE_OTHER): Payer: Self-pay | Admitting: Internal Medicine

## 2020-05-21 VITALS — Ht 70.0 in | Wt 218.0 lb

## 2020-05-21 DIAGNOSIS — K219 Gastro-esophageal reflux disease without esophagitis: Secondary | ICD-10-CM

## 2020-05-21 DIAGNOSIS — G5603 Carpal tunnel syndrome, bilateral upper limbs: Secondary | ICD-10-CM

## 2020-05-21 MED ORDER — MELOXICAM 15 MG PO TABS: 15.0000 mg | ORAL_TABLET | Freq: Every day | ORAL | 1 refills | Status: DC

## 2020-05-21 NOTE — Progress Notes (Signed)
Virtual Visit via Telephone Note  I connected with Billy Mann, on 05/21/2020 at 10:34 AM by telephone due to the COVID-19 pandemic and verified that I am speaking with the correct person using two identifiers.   Consent: I discussed the limitations, risks, security and privacy concerns of performing an evaluation and management service by telephone and the availability of in person appointments. I also discussed with the patient that there may be a patient responsible charge related to this service. The patient expressed understanding and agreed to proceed.   Location of Patient: Home   Location of Provider: Clinic    Persons participating in Telemedicine visit: Ollin Hochmuth Dr. Earlene Plater      History of Present Illness: Patient has a visit for concern about persistent heartburn. Takes Protonix daily--for over one month. Has also been adding an H2 blocker prn a couple times per week. Has epigastric burning that has continued despite taking PPI.   Reports that he has bilateral hand numbness. Woke up Thursday morning with this. Numbness located in thumb, index finger, middle finger only. Endorses bilateral wrist pain. Lasting throughout the day. Does repetitive hand motions at work.   Past Medical History:  Diagnosis Date  . Anxiety    Phreesia 03/30/2020  . Depression    Phreesia 03/30/2020  . GERD (gastroesophageal reflux disease)    Phreesia 03/30/2020  . High cholesterol   . Hyperlipidemia    Phreesia 03/30/2020  . Hypertension    Phreesia 03/30/2020  . Sleep apnea    Phreesia 03/30/2020   Allergies  Allergen Reactions  . Penicillins Anaphylaxis    Current Outpatient Medications on File Prior to Visit  Medication Sig Dispense Refill  . atomoxetine (STRATTERA) 80 MG capsule Take 1 capsule (80 mg total) by mouth daily. 30 capsule 2  . atorvastatin (LIPITOR) 20 MG tablet Take 1 tablet (20 mg total) by mouth daily. 90 tablet 3  . busPIRone  (BUSPAR) 10 MG tablet Take 1 tablet (10 mg total) by mouth 3 (three) times daily. 90 tablet 2  . emtricitabine-tenofovir AF (DESCOVY) 200-25 MG tablet Take 1 tablet by mouth daily.    . fluticasone (FLONASE) 50 MCG/ACT nasal spray Place 2 sprays into both nostrils daily. 16 g 6  . ibuprofen (ADVIL) 800 MG tablet TAKE 1 TABLET(800 MG) BY MOUTH THREE TIMES DAILY 90 tablet 1  . naproxen (NAPROSYN) 500 MG tablet Take 1 tablet (500 mg total) by mouth 2 (two) times daily with a meal. 60 tablet 1  . pantoprazole (PROTONIX) 40 MG tablet Take 1 tablet (40 mg total) by mouth daily. 30 tablet 3  . QUEtiapine (SEROQUEL) 100 MG tablet Take 1 tablet (100 mg total) by mouth at bedtime. 30 tablet 2  . traZODone (DESYREL) 50 MG tablet Take 1 tablet (50 mg total) by mouth at bedtime. 30 tablet 2  . clindamycin (CLEOCIN) 300 MG capsule Take 1 capsule (300 mg total) by mouth 4 (four) times daily. (Patient not taking: Reported on 05/21/2020) 40 capsule 0  . emtricitabine-tenofovir (TRUVADA) 200-300 MG tablet Take 1 tablet by mouth daily.    Marland Kitchen oxyCODONE-acetaminophen (PERCOCET) 5-325 MG tablet Take 1 tablet by mouth every 8 (eight) hours as needed for severe pain. (Patient not taking: Reported on 05/21/2020) 21 tablet 0  . predniSONE (STERAPRED UNI-PAK 21 TAB) 10 MG (21) TBPK tablet Use as directed (Patient not taking: Reported on 05/21/2020) 21 tablet 0   No current facility-administered medications on file prior to visit.  Observations/Objective: NAD. Speaking clearly.  Work of breathing normal.  Alert and oriented. Mood appropriate.   Assessment and Plan: 1. Gastroesophageal reflux disease, unspecified whether esophagitis present Negative H. Pylori test in Dec. Has been on chronic PPI since with intermittent use of H2 blocker without relief of heartburn. Therefore, warrants establishing with GI for further evaluation.  - Ambulatory referral to Gastroenterology  2. Bilateral carpal tunnel syndrome Symptoms sound  most consistent with carpel tunnel given distribution of numbness. Will fax Rx for bilateral cock up wrist splints to medical supply store. Start NSAID. Discussed rest and other supportive care measures. If not improving, will refer for EMG studies.  - meloxicam (MOBIC) 15 MG tablet; Take 1 tablet (15 mg total) by mouth daily.  Dispense: 30 tablet; Refill: 1   Follow Up Instructions: PRN and for routine medical care    I discussed the assessment and treatment plan with the patient. The patient was provided an opportunity to ask questions and all were answered. The patient agreed with the plan and demonstrated an understanding of the instructions.   The patient was advised to call back or seek an in-person evaluation if the symptoms worsen or if the condition fails to improve as anticipated.     I provided 9 minutes total of non-face-to-face time during this encounter including median intraservice time, reviewing previous notes, investigations, ordering medications, medical decision making, coordinating care and patient verbalized understanding at the end of the visit.    Marcy Siren, D.O. Primary Care at [facility]  05/21/2020, 10:34 AM

## 2020-05-21 NOTE — Progress Notes (Signed)
Reports numbness/tingling to bilateral hands w/ trouble gripping since last week

## 2020-05-27 ENCOUNTER — Encounter: Payer: Self-pay | Admitting: Nurse Practitioner

## 2020-05-28 ENCOUNTER — Other Ambulatory Visit: Payer: Self-pay | Admitting: Podiatry

## 2020-05-30 ENCOUNTER — Ambulatory Visit (INDEPENDENT_AMBULATORY_CARE_PROVIDER_SITE_OTHER): Payer: 59 | Admitting: Internal Medicine

## 2020-05-30 ENCOUNTER — Other Ambulatory Visit: Payer: Self-pay

## 2020-05-30 ENCOUNTER — Encounter: Payer: Self-pay | Admitting: Internal Medicine

## 2020-05-30 VITALS — BP 148/105 | HR 72 | Temp 98.0°F | Resp 18 | Ht 71.0 in | Wt 227.4 lb

## 2020-05-30 DIAGNOSIS — R030 Elevated blood-pressure reading, without diagnosis of hypertension: Secondary | ICD-10-CM

## 2020-05-30 DIAGNOSIS — G5603 Carpal tunnel syndrome, bilateral upper limbs: Secondary | ICD-10-CM | POA: Diagnosis not present

## 2020-05-30 DIAGNOSIS — N529 Male erectile dysfunction, unspecified: Secondary | ICD-10-CM

## 2020-05-30 MED ORDER — SILDENAFIL CITRATE 100 MG PO TABS: 50.0000 mg | ORAL_TABLET | Freq: Every day | ORAL | 5 refills | Status: DC | PRN

## 2020-05-30 NOTE — Progress Notes (Signed)
  Subjective:    Billy Mann - 40 y.o. male MRN [id number]  Date of birth: 03-20-1981  HPI  Billy Mann is here for follow up over concern of bilateral hand numbness. Had a televisit addressing this concern on 2/1. At that time had concerns for carpal tunnel syndrome. Patient reports he has had persistent bilateral numbness of thumbs, index, middle, and lateral portion of ring fingers. Feels urge to shake out his hands especially upon waking. Was able to buy one cock up wrist splint (unable to afford second) and has been alternating wearing at night. Has regular wrist splints wears during day as unable to wear cock up ones with his job. Has been Mobic without improvement. Had old Rx for Gabapentin at home and tried that as well with no change. Has a lot of loss of grip strength and is dropping things.      Health Maintenance:  Health Maintenance Due  Topic Date Due  . HIV Screening  Never done  . TETANUS/TDAP  Never done    -  reports that he has been smoking. He has never used smokeless tobacco. - Review of Systems: Per HPI. - Past Medical History: Patient Active Problem List   Diagnosis Date Noted  . Mixed hyperlipidemia 04/09/2020  . Attention deficit hyperactivity disorder (ADHD), 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  . Generalized anxiety disorder 02/19/2020  . 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  . 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    Appearance: He is not diaphoretic.  Cardiovascular:     Rate and Rhythm: Normal rate.  Pulmonary:     Effort:  Pulmonary effort is normal. No respiratory distress.  Musculoskeletal:        General: Normal range of motion.  Skin:    General: Skin is warm and dry.  Neurological:     Mental Status: He is alert and oriented to person, place, and time.     Comments: Positive Tinel's and Phalen's test bilaterally.   Psychiatric:        Mood and Affect: Affect normal.        Judgment: Judgment normal.            Assessment & Plan:   1. Bilateral carpal tunnel syndrome Clinically and with special testing patient appears to have severe carpal tunnel syndrome bilaterally. Continue supportive care at home and refer to hand surgery for further evaluation.  - Ambulatory referral to Hand Surgery  2. Erectile dysfunction, unspecified erectile dysfunction type - sildenafil (VIAGRA) 100 MG tablet; Take 0.5-1 tablets (50-100 mg total) by mouth daily as needed for erectile dysfunction.  Dispense: 15 tablet; Refill: 5  3. Elevated blood pressure reading without diagnosis of hypertension BP is elevated today. Denies prior h/o HTN. Will continue to monitor.    , D.O. 05/30/2020, 11:18 AM Primary Care at [facility]

## 2020-05-30 NOTE — Progress Notes (Signed)
Carpal tunnel pain x 3 wks, getting worse, affecting sleep, medications not working

## 2020-06-05 ENCOUNTER — Telehealth: Payer: Self-pay | Admitting: Internal Medicine

## 2020-06-05 ENCOUNTER — Ambulatory Visit: Payer: Self-pay | Admitting: Orthopaedic Surgery

## 2020-06-05 NOTE — Telephone Encounter (Signed)
Pt is in need of pain medication for his carpal tunnel. He is waiting for the appt with Dr. Roda Shutters but meantime is in pain and would like medication prescribed. Please advise and thank you.        [facility] [address] [place] - [address] (Ph: [phone])

## 2020-06-08 ENCOUNTER — Telehealth: Payer: Self-pay | Admitting: Physician Assistant

## 2020-06-08 DIAGNOSIS — R079 Chest pain, unspecified: Secondary | ICD-10-CM

## 2020-06-08 DIAGNOSIS — J069 Acute upper respiratory infection, unspecified: Secondary | ICD-10-CM

## 2020-06-08 NOTE — Progress Notes (Signed)
Based on what you shared with me, I feel your condition warrants further evaluation and I recommend that you be seen for a face to face office visit. Giving chest pain with this illness you need examination to make sure nothing further is needed -- imaging, labs, etc. We want to make sure you get the most appropriate care possible for this.    NOTE: If you entered your credit card information for this eVisit, you will not be charged. You may see a "hold" on your card for the $35 but that hold will drop off and you will not have a charge processed.   If you are having a true medical emergency please call 911.      For an urgent face to face visit, [place] has five urgent care centers for your convenience:     [facility] at [facility] Directions [phone] [address] [place], [place] [address] . 10 am - 6pm Monday - Friday    [facility] Urgent Care Center [facility]) Get Driving Directions [phone] [address] [place], [place] [address] . 10 am to 8 pm Monday-Friday . 12 pm to 8 pm [facility] Urgent Care at [facility] Get Driving Directions [phone] [address] [place], [place] [address] . 8 am to 8 pm Monday-Friday . 9 am to 6 pm Saturday . 11 am to 6 pm Sunday     [facility] Urgent Care at [facility] Get Driving Directions  [phone] [address] [place], [place] [address] . 8 am to 8 pm Monday-Friday . 8 am to 4 pm [facility] Urgent Care at [facility] Directions [phone] [address] [place], [place] [address] . 12 pm to 6 pm Monday-Friday      Your e-visit answers were reviewed by a board certified advanced clinical practitioner to complete your personal care plan.  Thank you for using e-Visits.

## 2020-06-09 NOTE — Progress Notes (Signed)
Marland KitchenMarland KitchenMarland Kitchen...     06/09/2020 Billy Mann [id number] 1980/12/13   CHIEF COMPLAINT: Acid Reflux, loose stools  HISTORY OF PRESENT ILLNESS: Billy Mann is a 40 year old male with a past medical history of anxiety, depression, hypertension, hypercholesterolemia, sleep apnea does not ue cpap, GERD and infectious colitis 06/2017. Past tonsillectomy and bilateral hammertoe surgery.  He presents to our office today as referred by Dr. Juleen China for further evaluation regarding GERD. He reports having GERD since his early 20's. He took Prilosec with Ranitidine in the past which controlled his acid reflux symptoms. He stopped taking Ranitidine when it was taken off the market a few years ago. His reflux symptoms have progressively worsened since then. He started Protonix $RemoveBeforeDE'40mg'[id number]$  QD approximately 2 months ago without improvement. He awakens at night with acid reflux, a few times over the past 2 weeks he felt acid fluid back up into his mouth and he vomited up clear nonbloody emesis/  A few times he felt the acid back up and he swallowed it without vomiting. No dysphagia. He has upper abdominal bloat which becomes tight and uncomfortable at times but denies any significant upper or lower abdominal pain. He takes Ibuprofen $RemoveBeforeD'800mg'[id number]$  one tab occasionally for foot or back pain, approximately 5 does over the past month. No alcohol use.  He denies ever having an EGD.  He reports being diagnosed with infectious colitis admitted to [facility] for one week 06/2017 which resolved after he received antibiotics. In review of his hospital records, he was admitted to the hospital 3/13/20219 with lower abdominal pain and diarrhea with sepsis with a fever and leukocytosis.  CTAP showed diffuse wall thickening extending from the cecum to the splenic flexure suggestive of underlying colitis.  No evidence of diverticulitis. Stool cultures and C. difficile toxin PCR  Negative.  He denies ever having a colonoscopy. Since his  diagnosis of infectious colitis, he continues to have nonbloody loose mud like stools 3 to 5 times daily.  No further watery diarrhea.  No associated abdominal or rectal pain. He was prescribed a course of Clindamycin for a dental infection 12/2017 and 03/2020. He is gay, married on Descovy prophylaxis.  No known family history of IBD or colorectal cancer.  No other complains at this time.   CTAP 06/30/2017: EXAM DESCRIPTION: CT ABDOMEN PELVIS W CONTRAST CLINICAL HISTORY: 40 years Male Abd pain, diverticulitis suspected;LLQ pain; chills  TECHNIQUE: Contiguous axial images obtained through the abdomen and pelvis following IV contrast. 100 mL of Optiray 320 administered IV. Reformatted images obtained.  FINDINGS: The visualized lung bases are unremarkable. The liver appears unremarkable. The spleen and pancreas appear unremarkable. No adrenal masses. The kidneys appear unremarkable. No hydronephrosis. The gallbladder is visualized. No aneurysmal dilatation of the aorta. There is diffuse colonic wall thickening predominantly beginning at the cecum and extending through the transverse colon to the level of the splenic flexure. The colon is not significantly dilated. Mild diffuse adjacent inflammatory changes are present. No focal diverticula are identified. The appendix is normal in caliber. No evidence of intra-abdominal free air is identified. The small bowel is normal in caliber. No CT evidence of obstructive process. No significant free fluid noted. No suspicious lytic or blastic osseous lesions are identified. Impression:  Diffuse wall thickening extending from the cecum to the splenic flexure is suggestive of underlying colitis. This finding is nonspecific. Clinical correlation is advised. No focal diverticula are identified.     CBC Latest Ref Rng & Units 04/27/2020 04/04/2020 10/02/2019  WBC 4.0 - 10.5 K/uL 13.5(H) 7.2 8.4  Hemoglobin 13.0 - 17.0 g/dL 15.4 15.8 14.4  Hematocrit 39.0 - 52.0 %  42.3 45.2 39.5  Platelets 150 - 400 K/uL 215 217 159    CMP Latest Ref Rng & Units 04/27/2020 04/04/2020 10/02/2019  Glucose 70 - 99 mg/dL 92 104(H) 108(H)  BUN 6 - 20 mg/dL 15 13 27(H)  Creatinine 0.61 - 1.24 mg/dL 1.04 1.17 1.40(H)  Sodium 135 - 145 mmol/L 138 142 137  Potassium 3.5 - 5.1 mmol/L 3.1(L) 4.6 3.1(L)  Chloride 98 - 111 mmol/L 109 105 105  CO2 22 - 32 mmol/L 18(L) 25 23  Calcium 8.9 - 10.3 mg/dL 7.7(L) 9.2 8.4(L)  Total Protein 6.5 - 8.1 g/dL 5.7(L) 6.7 6.5  Total Bilirubin 0.3 - 1.2 mg/dL 1.7(H) 0.3 1.5(H)  Alkaline Phos 38 - 126 U/L 54 87 54  AST 15 - 41 U/L 35 24 38  ALT 0 - 44 U/L 40 43 38   H. pylori breath test - 04/04/2020.  Hep B surface antigen nonreactive. Hep B core IgM nonreactive. Hep C ab nonreactive. 07/01/2017.   Past Medical History:  Diagnosis Date  . Anxiety    Phreesia 03/30/2020  . Depression    Phreesia 03/30/2020  . GERD (gastroesophageal reflux disease)    Phreesia 03/30/2020  . High cholesterol   . Hyperlipidemia    Phreesia 03/30/2020  . Hypertension    Phreesia 03/30/2020  . Sleep apnea    Phreesia 03/30/2020   Social History: He is gay. He is married.  He works as a Chief Operating Officer.  He smokes cigarettes from age 1ppd down to 5 cigarettes daily. No alcohol use. Patient denies drug use, Epic records report past Methamphetamine use.   Family History: Father in his late 56's with diabetes. Mother age 53 with heart problems and arthritis. Brother is healthy. Paternal grandmother with history of breast cancer.   Allergies  Allergen Reactions  . Penicillins Anaphylaxis      Outpatient Encounter Medications as of 06/10/2020  Medication Sig  . atorvastatin (LIPITOR) 20 MG tablet Take 1 tablet (20 mg total) by mouth daily.  . busPIRone (BUSPAR) 10 MG tablet Take 1 tablet (10 mg total) by mouth 3 (three) times daily.  . clindamycin (CLEOCIN) 300 MG capsule Take 1 capsule (300 mg total) by mouth 4 (four) times daily. (Patient not taking: No sig  reported)  . emtricitabine-tenofovir (TRUVADA) 200-300 MG tablet Take 1 tablet by mouth daily.  Marland Kitchen emtricitabine-tenofovir AF (DESCOVY) 200-25 MG tablet Take 1 tablet by mouth daily.  . fluticasone (FLONASE) 50 MCG/ACT nasal spray Place 2 sprays into both nostrils daily.  Marland Kitchen ibuprofen (ADVIL) 800 MG tablet TAKE 1 TABLET(800 MG) BY MOUTH THREE TIMES DAILY  . meloxicam (MOBIC) 15 MG tablet Take 1 tablet (15 mg total) by mouth daily.  . pantoprazole (PROTONIX) 40 MG tablet Take 1 tablet (40 mg total) by mouth daily.  . QUEtiapine (SEROQUEL) 100 MG tablet Take 1 tablet (100 mg total) by mouth at bedtime.  . sildenafil (VIAGRA) 100 MG tablet Take 0.5-1 tablets (50-100 mg total) by mouth daily as needed for erectile dysfunction.  . traZODone (DESYREL) 50 MG tablet Take 1 tablet (50 mg total) by mouth at bedtime.   No facility-administered encounter medications on file as of 06/10/2020.   REVIEW OF SYSTEMS:  Gen: Denies fever, sweats or chills. No weight loss.  CV: Denies chest pain, palpitations or edema. Resp: Denies cough, shortness of breath of hemoptysis.  GI: See HPI. GU : Denies urinary burning, blood in urine, increased urinary frequency or incontinence. MS: Denies joint pain, muscles aches or weakness. Derm: Denies rash, itchiness, skin lesions or unhealing ulcers. Psych: + Anxiety and depression. Heme: Denies bruising, bleeding. Neuro:  Denies headaches, dizziness or paresthesias. Endo:  Denies any problems with DM, thyroid or adrenal function.  PHYSICAL EXAM: BP 138/72   Pulse 81   Ht $R'5\' 10"'ks$  (1.778 m)   Wt 226 lb 3.2 oz (102.6 kg)   SpO2 98%   BMI 32.46 kg/m   General: Well developed 40 year old male in no acute distress. Head: Normocephalic and atraumatic. Eyes:  Sclerae non-icteric, conjunctive pink. Ears: Normal auditory acuity. Mouth: Dentition intact. No ulcers or lesions.  Neck: Supple, no lymphadenopathy or thyromegaly.  Lungs: Clear bilaterally to auscultation  without wheezes, crackles or rhonchi. Heart: Regular rate and rhythm. No murmur, rub or gallop appreciated.  Abdomen: Soft, nontender, non distended. No masses. No hepatosplenomegaly. Normoactive bowel sounds x 4 quadrants.  Rectal: Deferred.  Musculoskeletal: Symmetrical with no gross deformities. Skin: Warm and dry. No rash or lesions on visible extremities. Extremities: No edema. Neurological: Alert oriented x 4, no focal deficits.  Psychological:  Alert and cooperative. Normal mood and affect.  ASSESSMENT AND PLAN:  16.  40 year old male with chronic GERD with worsening acid reflux including 2-3 episodes of vomiting nonbloody clear emesis.  H. pylori breath test - 04/04/2020. -Pantoprazole 40 mg in the a.m.  Famotidine 40 mg nightly. -GERD handout -Avoid NSAIDs -Recommend scheduling an EGD at the time of colonoscopy. See plan below.   2.  Patient was admitted to the hospital 06/2017 with suspected infectious colitis.  CTAP 06/2017 showed diffuse wall thickening extending from the cecum to the splenic flexure is suggestive of underlying colitis.  Stool cultures and C. difficile testing were negative.  Since his hospital admission for colitis, he continues to have 1 to 3 loose nonbloody stools daily. He was prescribed Clindamycin 01/07/2018 and 04/08/2020 due to dental infections at risk for C. difficile infection. -C. difficile PCR, CBC and CMP -Diagnostic colonoscopy recommended, await C. difficile PCR results. -Probiotic of choice once daily -Odium 1 tab p.o. daily -Follow-up in the office in 6 weeks -Patient to call our office if symptoms worsen  3.  Elevated total bilirubin level with normal Alk phos, AST and ALT levels.  Possible Gilbert's  syndrome. -CMP as ordered above     CC:  Caryl Never*

## 2020-06-10 ENCOUNTER — Ambulatory Visit (INDEPENDENT_AMBULATORY_CARE_PROVIDER_SITE_OTHER): Payer: 59 | Admitting: Nurse Practitioner

## 2020-06-10 ENCOUNTER — Encounter: Payer: Self-pay | Admitting: Nurse Practitioner

## 2020-06-10 ENCOUNTER — Other Ambulatory Visit: Payer: Self-pay

## 2020-06-10 ENCOUNTER — Other Ambulatory Visit (INDEPENDENT_AMBULATORY_CARE_PROVIDER_SITE_OTHER): Payer: 59

## 2020-06-10 VITALS — BP 138/72 | HR 81 | Ht 70.0 in | Wt 226.2 lb

## 2020-06-10 DIAGNOSIS — R197 Diarrhea, unspecified: Secondary | ICD-10-CM

## 2020-06-10 DIAGNOSIS — K219 Gastro-esophageal reflux disease without esophagitis: Secondary | ICD-10-CM | POA: Diagnosis not present

## 2020-06-10 LAB — COMPREHENSIVE METABOLIC PANEL
ALT: 67 U/L — ABNORMAL HIGH (ref 0–53)
AST: 38 U/L — ABNORMAL HIGH (ref 0–37)
Albumin: 4.6 g/dL (ref 3.5–5.2)
Alkaline Phosphatase: 72 U/L (ref 39–117)
BUN: 21 mg/dL (ref 6–23)
CO2: 27 mEq/L (ref 19–32)
Calcium: 9.4 mg/dL (ref 8.4–10.5)
Chloride: 105 mEq/L (ref 96–112)
Creatinine, Ser: 1.15 mg/dL (ref 0.40–1.50)
GFR: 79.93 mL/min (ref >60.00)
Glucose, Bld: 99 mg/dL (ref 70–99)
Potassium: 4.1 mEq/L (ref 3.5–5.1)
Sodium: 139 mEq/L (ref 135–145)
Total Bilirubin: 1.1 mg/dL (ref 0.2–1.2)
Total Protein: 7.1 g/dL (ref 6.0–8.3)

## 2020-06-10 LAB — CBC WITH DIFFERENTIAL/PLATELET
Basophils Absolute: 0.1 10*3/uL (ref 0.0–0.1)
Basophils Relative: 0.7 % (ref 0.0–3.0)
Eosinophils Absolute: 0.2 10*3/uL (ref 0.0–0.7)
Eosinophils Relative: 2.6 % (ref 0.0–5.0)
HCT: 40 % (ref 39.0–52.0)
Hemoglobin: 14.2 g/dL (ref 13.0–17.0)
Lymphocytes Relative: 27 % (ref 12.0–46.0)
Lymphs Abs: 2.3 10*3/uL (ref 0.7–4.0)
MCHC: 35.5 g/dL (ref 30.0–36.0)
MCV: 93.3 fl (ref 78.0–100.0)
Monocytes Absolute: 0.8 10*3/uL (ref 0.1–1.0)
Monocytes Relative: 9.1 % (ref 3.0–12.0)
Neutro Abs: 5.3 10*3/uL (ref 1.4–7.7)
Neutrophils Relative %: 60.6 % (ref 43.0–77.0)
Platelets: 183 10*3/uL (ref 150.0–400.0)
RBC: 4.29 Mil/uL (ref 4.22–5.81)
RDW: 13 % (ref 11.5–15.5)
WBC: 8.7 10*3/uL (ref 4.0–10.5)

## 2020-06-10 MED ORDER — PANTOPRAZOLE SODIUM 40 MG PO TBEC: 40.0000 mg | DELAYED_RELEASE_TABLET | Freq: Every day | ORAL | 3 refills | Status: DC

## 2020-06-10 MED ORDER — FAMOTIDINE 40 MG PO TABS: 40.0000 mg | ORAL_TABLET | Freq: Every day | ORAL | 3 refills | Status: DC

## 2020-06-10 NOTE — Patient Instructions (Addendum)
If you are age 40 or younger, your body mass index should be between 19-25. Your Body mass index is 32.46 kg/m. If this is out of the aformentioned range listed, please consider follow up with your Primary Care Provider.   LABS:  Lab work has been ordered for you today. Our lab is located in the basement. Press "B" on the elevator. The lab is located at the first door on the left as you exit the elevator.  HEALTHCARE LAWS AND MY CHART RESULTS: Due to recent changes in healthcare laws, you may see the results of your imaging and laboratory studies on MyChart before your provider has had a chance to review them.   We understand that in some cases there may be results that are confusing or concerning to you. Not all laboratory results come back in the same time frame and the provider may be waiting for multiple results in order to interpret others.  Please give [place] 48 hours in order for your provider to thoroughly review all the results before contacting the office for clarification of your results.   MEDICATION  We have sent the following medication to your pharmacy for you to pick up at your convenience: Pantoprazole 40 MG once a day. Famotidine 40 MG once a day.  OVER THE COUNTER MEDICATION Please purchase the following medications over the counter and take as directed:  Florastor Probiotic take twice a day for 4 weeks.  Please call to schedule a follow up in 6 weeks. It was great seeing you today! Thank you for entrusting me with your care and choosing [facility].  Arnaldo Natal, CRNP   Gastroesophageal Reflux Disease, Adult  Gastroesophageal reflux (GER) happens when acid from the stomach flows up into the tube that connects the mouth and the stomach (esophagus). Normally, food travels down the esophagus and stays in the stomach to be digested. With GER, food and stomach acid sometimes move back up into the esophagus. You may have a disease called gastroesophageal  reflux disease (GERD) if the reflux:  Happens often.  Causes frequent or very bad symptoms.  Causes problems such as damage to the esophagus. When this happens, the esophagus becomes sore and swollen. Over time, GERD can make small holes (ulcers) in the lining of the esophagus. What are the causes? This condition is caused by a problem with the muscle between the esophagus and the stomach. When this muscle is weak or not normal, it does not close properly to keep food and acid from coming back up from the stomach. The muscle can be weak because of:  Tobacco use.  Pregnancy.  Having a certain type of hernia (hiatal hernia).  Alcohol use.  Certain foods and drinks, such as coffee, chocolate, onions, and peppermint. What increases the risk?  Being overweight.  Having a disease that affects your connective tissue.  Taking NSAIDs, such a ibuprofen. What are the signs or symptoms?  Heartburn.  Difficult or painful swallowing.  The feeling of having a lump in the throat.  A bitter taste in the mouth.  Bad breath.  Having a lot of saliva.  Having an upset or bloated stomach.  Burping.  Chest pain. Different conditions can cause chest pain. Make sure you see your doctor if you have chest pain.  Shortness of breath or wheezing.  A long-term cough or a cough at night.  Wearing away of the surface of teeth (tooth enamel).  Weight loss. How is this treated?  Making changes  to your diet.  Taking medicine.  Having surgery. Treatment will depend on how bad your symptoms are. Follow these instructions at home: Eating and drinking  Follow a diet as told by your doctor. You may need to avoid foods and drinks such as: ? Coffee and tea, with or without caffeine. ? Drinks that contain alcohol. ? Energy drinks and sports drinks. ? Bubbly (carbonated) drinks or sodas. ? Chocolate and cocoa. ? Peppermint and mint flavorings. ? Garlic and  onions. ? Horseradish. ? Spicy and acidic foods. These include peppers, chili powder, curry powder, vinegar, hot sauces, and BBQ sauce. ? Citrus fruit juices and citrus fruits, such as oranges, lemons, and limes. ? Tomato-based foods. These include red sauce, chili, salsa, and pizza with red sauce. ? Fried and fatty foods. These include donuts, french fries, potato chips, and high-fat dressings. ? High-fat meats. These include hot dogs, rib eye steak, sausage, ham, and bacon. ? High-fat dairy items, such as whole milk, butter, and cream cheese.  Eat small meals often. Avoid eating large meals.  Avoid drinking large amounts of liquid with your meals.  Avoid eating meals during the 2-3 hours before bedtime.  Avoid lying down right after you eat.  Do not exercise right after you eat.   Lifestyle  Do not smoke or use any products that contain nicotine or tobacco. If you need help quitting, ask your doctor.  Try to lower your stress. If you need help doing this, ask your doctor.  If you are overweight, lose an amount of weight that is healthy for you. Ask your doctor about a safe weight loss goal.   General instructions  Pay attention to any changes in your symptoms.  Take over-the-counter and prescription medicines only as told by your doctor.  Do not take aspirin, ibuprofen, or other NSAIDs unless your doctor says it is okay.  Wear loose clothes. Do not wear anything tight around your waist.  Raise (elevate) the head of your bed about 6 inches (15 cm). You may need to use a wedge to do this.  Avoid bending over if this makes your symptoms worse.  Keep all follow-up visits. Contact a doctor if:  You have new symptoms.  You lose weight and you do not know why.  You have trouble swallowing or it hurts to swallow.  You have wheezing or a cough that keeps happening.  You have a hoarse voice.  Your symptoms do not get better with treatment. Get help right away if:  You  have sudden pain in your arms, neck, jaw, teeth, or back.  You suddenly feel sweaty, dizzy, or light-headed.  You have chest pain or shortness of breath.  You vomit and the vomit is green, yellow, or black, or it looks like blood or coffee grounds.  You faint.  Your poop (stool) is red, bloody, or black.  You cannot swallow, drink, or eat. These symptoms may represent a serious problem that is an emergency. Do not wait to see if the symptoms will go away. Get medical help right away. Call your local emergency services (911 in the [place] Do not drive yourself to the hospital. Summary  If a person has gastroesophageal reflux disease (GERD), food and stomach acid move back up into the esophagus and cause symptoms or problems such as damage to the esophagus.  Treatment will depend on how bad your symptoms are.  Follow a diet as told by your doctor.  Take all medicines only as told by  your doctor. This information is not intended to replace advice given to you by your health care provider. Make sure you discuss any questions you have with your health care provider. Document Revised: 10/16/2019 Document Reviewed: 10/16/2019 Elsevier Patient Education  2021 [employer].

## 2020-06-11 ENCOUNTER — Ambulatory Visit (INDEPENDENT_AMBULATORY_CARE_PROVIDER_SITE_OTHER): Payer: 59 | Admitting: Orthopaedic Surgery

## 2020-06-11 ENCOUNTER — Encounter: Payer: Self-pay | Admitting: Orthopaedic Surgery

## 2020-06-11 ENCOUNTER — Telehealth: Payer: Self-pay | Admitting: Orthopaedic Surgery

## 2020-06-11 ENCOUNTER — Other Ambulatory Visit: Payer: Self-pay | Admitting: Physician Assistant

## 2020-06-11 ENCOUNTER — Other Ambulatory Visit: Payer: Self-pay

## 2020-06-11 DIAGNOSIS — G5603 Carpal tunnel syndrome, bilateral upper limbs: Secondary | ICD-10-CM | POA: Diagnosis not present

## 2020-06-11 MED ORDER — GABAPENTIN 100 MG PO CAPS: 100.0000 mg | ORAL_CAPSULE | Freq: Three times a day (TID) | ORAL | 2 refills | Status: DC

## 2020-06-11 NOTE — Progress Notes (Signed)
I agree with the above note, plan 

## 2020-06-11 NOTE — Telephone Encounter (Signed)
Pt called stating he had an appt today and was supposed to have gabapentn called in, he would like this done and would like a CB to update him.   [phone]

## 2020-06-11 NOTE — Progress Notes (Signed)
Office Visit Note   Patient: Billy Mann           Date of Birth: 02-11-1981           MRN: [id number] Visit Date: 06/11/2020              Requested by: Arvilla Market, DO [address] [place],  [place] [address] PCP: Arvilla Market, DO   Assessment & Plan: Visit Diagnoses:  1. Bilateral carpal tunnel syndrome     Plan: Impression is bilateral carpal tunnel syndrome.  At this point, we will refer the patient to Dr. Alvester Morin for nerve conduction study/EMG.  He will follow up with [place] once that has been completed.  Call with concerns or questions in the meantime.  Follow-Up Instructions: Return for after NCS/EMG.   Orders:  No orders of the defined types were placed in this encounter.  No orders of the defined types were placed in this encounter.     Procedures: No procedures performed   Clinical Data: No additional findings.   Subjective: Chief Complaint  Patient presents with  . Right Hand - Pain  . Left Hand - Pain    HPI patient is a pleasant 40 year old right-hand-dominant gentleman who works as a Designer, television/film set who comes in today with bilateral hand pain and paresthesias right greater than left for the past year.  He notes that his symptoms have worsened over the past 1 month.  No known injury.  The pain he has is primarily to the thumb and index fingers but occasionally notes pain into the radial side of the ring finger.  The symptoms are worse at night as well as while working.  He is starting to drop things.  He has been wearing a wrist plan at night which does not seem to help.  No previous nerve conduction study.  Review of Systems as detailed in HPI.  All others reviewed and are negative.   Objective: Vital Signs: There were no vitals taken for this visit.  Physical Exam well-developed well-nourished gentleman in no acute distress.  Alert and oriented x3.  Ortho Exam bilateral hand exam shows a positive Phalen and positive  Tinel at the wrist.  No thenar atrophy.  He is neurovascular intact distally.  Specialty Comments:  No specialty comments available.  Imaging: No new imaging   PMFS History: Patient Active Problem List   Diagnosis Date Noted  . GERD (gastroesophageal reflux disease) 06/10/2020  . Diarrhea 06/10/2020  . Mixed hyperlipidemia 04/09/2020  . Attention deficit hyperactivity disorder (ADHD), 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  . Generalized anxiety disorder 02/19/2020  . 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  . 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 Anxiety    Phreesia 03/30/2020  . Anxiety   . Depression    Phreesia 03/30/2020  . Depression   . GERD (gastroesophageal reflux disease)    Phreesia 03/30/2020  . High cholesterol   . Hyperlipidemia    Phreesia 03/30/2020  . Hypertension    Phreesia 03/30/2020  . Sleep apnea    Phreesia 03/30/2020    Family History  Problem Relation Age of Onset  . Heart disease Mother   . Heart disease Father   . Diabetes Paternal Grandmother     Past Surgical History:  Procedure Laterality Date  . FOOT SURGERY     bilateral hammer toe  . TONSILLECTOMY     Social History   Occupational History  .  Occupation: bartender  Tobacco Use  . Smoking status: Current Every Day Smoker  . Smokeless tobacco: Never Used  Vaping Use  . Vaping Use: Never used  Substance and Sexual Activity  . Alcohol use: Never  . Drug use: Yes    Types: Methamphetamines  . Sexual activity: Not on file

## 2020-06-11 NOTE — Telephone Encounter (Signed)
Sent in

## 2020-06-11 NOTE — Telephone Encounter (Signed)
No answer LMOM 

## 2020-06-11 NOTE — Telephone Encounter (Signed)
Called to let him know

## 2020-06-19 ENCOUNTER — Telehealth: Payer: 59 | Admitting: Nurse Practitioner

## 2020-06-19 ENCOUNTER — Other Ambulatory Visit: Payer: Self-pay

## 2020-06-19 DIAGNOSIS — N529 Male erectile dysfunction, unspecified: Secondary | ICD-10-CM

## 2020-06-19 DIAGNOSIS — B001 Herpesviral vesicular dermatitis: Secondary | ICD-10-CM | POA: Diagnosis not present

## 2020-06-19 MED ORDER — SILDENAFIL CITRATE 100 MG PO TABS: 50.0000 mg | ORAL_TABLET | Freq: Every day | ORAL | 5 refills | Status: DC | PRN

## 2020-06-19 MED ORDER — VALACYCLOVIR HCL 1 G PO TABS: 1000.0000 mg | ORAL_TABLET | Freq: Three times a day (TID) | ORAL | 0 refills | Status: DC

## 2020-06-19 MED ORDER — NYSTATIN 100000 UNIT/ML MT SUSP: 5.0000 mL | Freq: Four times a day (QID) | OROMUCOSAL | 0 refills | Status: DC

## 2020-06-19 NOTE — Progress Notes (Signed)
We are sorry that you are not feeling well.  Here is how we plan to help!  Based on what you have shared with me it does look like you have a viral infection.    Most cold sores or fever blisters are small fluid filled blisters around the mouth caused by herpes simplex virus.  The most common strain of the virus causing cold sores is herpes simplex virus 1.  It can be spread by skin contact, sharing eating utensils, or even sharing towels.  Cold sores are contagious to other people until dry. (Approximately 5-7 days).  Wash your hands. You can spread the virus to your eyes through handling your contact lenses after touching the lesions.  Most people experience pain at the sight or tingling sensations in their lips that may begin before the ulcers erupt.  Herpes simplex is treatable but not curable.  It may lie dormant for a long time and then reappear due to stress or prolonged sun exposure.  Many patients have success in treating their cold sores with an over the counter topical called Abreva.  You may apply the cream up to 5 times daily (maximum 10 days) until healing occurs.  If you would like to use an oral antiviral medication to speed the healing of your cold sore, I have sent a prescription to your local pharmacy Valacyclovir 2 gm take one by mouth twice a day for 1 day and magic mouth wash 17ml qid.    HOME CARE:   Wash your hands frequently.  Do not pick at or rub the sore.  Don't open the blisters.  Avoid kissing other people during this time.  Avoid sharing drinking glasses, eating utensils, or razors.  Do not handle contact lenses unless you have thoroughly washed your hands with soap and warm water!  Avoid oral sex during this time.  Herpes from sores on your mouth can spread to your partner's genital area.  Avoid contact with anyone who has eczema or a weakened immune system.  Cold sores are often triggered by exposure to intense sunlight, use a lip balm containing a  sunscreen (SPF 30 or higher).  GET HELP RIGHT AWAY IF:   Blisters look infected.  Blisters occur near or in the eye.  Symptoms last longer than 10 days.  Your symptoms become worse.  MAKE SURE YOU:   Understand these instructions.  Will watch your condition.  Will get help right away if you are not doing well or get worse.    Your e-visit answers were reviewed by a board certified advanced clinical practitioner to complete your personal care plan.  Depending upon the condition, your plan could have  Included both over the counter or prescription medications.    Please review your pharmacy choice.  Be sure that the pharmacy you have chosen is open so that you can pick up your prescription now.  If there is a problem you can message your provider in MyChart to have the prescription routed to another pharmacy.    Your safety is important to [place].  If you have drug allergies check our prescription carefully.  For the next 24 hours you can use MyChart to ask questions about today's visit, request a non-urgent call back, or ask for a work or school excuse from your e-visit provider.  You will get an email in the next two days asking about your experience.  I hope that your e-visit has been valuable and will speed your recovery.  5-10  minutes spent reviewing and documenting in chart.

## 2020-06-20 ENCOUNTER — Other Ambulatory Visit: Payer: Self-pay | Admitting: Nurse Practitioner

## 2020-06-20 DIAGNOSIS — R7401 Elevation of levels of liver transaminase levels: Secondary | ICD-10-CM

## 2020-06-21 ENCOUNTER — Telehealth: Payer: Self-pay

## 2020-06-21 NOTE — Telephone Encounter (Signed)
Contacted [facility] for follow-up on Viagra PA, they reported questions to be answered, gave answers, will likely cover 6 tablets per 30 days, pharmacy aware and stated that pt has already picked up 6 tablets that he paid cash for through [facility], they will attempt to run claim for new script of 6 tablets during next refill and follow-up if needed.

## 2020-06-25 ENCOUNTER — Telehealth: Payer: 59 | Admitting: Physician Assistant

## 2020-06-25 DIAGNOSIS — B9689 Other specified bacterial agents as the cause of diseases classified elsewhere: Secondary | ICD-10-CM

## 2020-06-25 DIAGNOSIS — J019 Acute sinusitis, unspecified: Secondary | ICD-10-CM

## 2020-06-25 MED ORDER — DOXYCYCLINE HYCLATE 100 MG PO CAPS: 100.0000 mg | ORAL_CAPSULE | Freq: Two times a day (BID) | ORAL | 0 refills | Status: DC

## 2020-06-25 NOTE — Progress Notes (Signed)
I have spent 5 minutes in review of e-visit questionnaire, review and updating patient chart, medical decision making and response to patient.   Uno Esau Cody Kaicee Scarpino, PA-C    

## 2020-06-25 NOTE — Telephone Encounter (Signed)
Sorry I meant that his original message was sent two weeks ago while I was out and it was routed to me and sat in my inbasket for two weeks. While I am at L&D, it is fine to route messages to me.   Marcy Siren, D.O. Primary Care at [facility]  06/25/2020, 2:02 PM

## 2020-06-25 NOTE — Telephone Encounter (Signed)
It appears that he saw Dr. Roda Shutters on 2/22 and hopefully they worked out a plan for pain management. If patient has acute concerns while I am out, please ensure [name] Zonia Kief is seeing the message. Thanks.

## 2020-06-25 NOTE — Telephone Encounter (Signed)
[name] Is out of the office today on PAL.  Asked patient if he has called Dr.Xu office to see if gabapentin could be adjusted to help more with discomfort. Patient states he did not. Advised him to do so.  Patient verbalized understanding.    He states he was told he has to do a nerve conduction study on April 8th.

## 2020-06-25 NOTE — Progress Notes (Signed)

## 2020-07-10 ENCOUNTER — Ambulatory Visit (HOSPITAL_COMMUNITY): Payer: No Payment, Other | Admitting: Licensed Clinical Social Worker

## 2020-07-20 ENCOUNTER — Other Ambulatory Visit: Payer: Self-pay | Admitting: Internal Medicine

## 2020-07-20 DIAGNOSIS — G5603 Carpal tunnel syndrome, bilateral upper limbs: Secondary | ICD-10-CM

## 2020-07-21 ENCOUNTER — Telehealth: Payer: 59 | Admitting: Nurse Practitioner

## 2020-07-21 DIAGNOSIS — K0889 Other specified disorders of teeth and supporting structures: Secondary | ICD-10-CM

## 2020-07-21 MED ORDER — NAPROXEN 500 MG PO TABS: 500.0000 mg | ORAL_TABLET | Freq: Two times a day (BID) | ORAL | 0 refills | Status: DC

## 2020-07-21 MED ORDER — CLINDAMYCIN HCL 300 MG PO CAPS: 300.0000 mg | ORAL_CAPSULE | Freq: Four times a day (QID) | ORAL | 0 refills | Status: AC

## 2020-07-21 NOTE — Progress Notes (Signed)
E-Visit for Dental Pain  We are sorry that you are not feeling well.  Here is how we plan to help!  Based on what you have shared with me in the questionnaire, it sounds like you have dental pain due to a broken tooth.  naprosyn 500mg  2 times per day for 7 days for discomfort and Clindamycin 300mg  4 times per day for days  It is imperative that you see a dentist within 10 days of this eVisit to determine the cause of the dental pain and be sure it is adequately treated  A toothache or tooth pain is caused when the nerve in the root of a tooth or surrounding a tooth is irritated. Dental (tooth) infection, decay, injury, or loss of a tooth are the most common causes of dental pain. Pain may also occur after an extraction (tooth is pulled out). Pain sometimes originates from other areas and radiates to the jaw, thus appearing to be tooth pain.Bacteria growing inside your mouth can contribute to gum disease and dental decay, both of which can cause pain. A toothache occurs from inflammation of the central portion of the tooth called pulp. The pulp contains nerve endings that are very sensitive to pain. Inflammation to the pulp or pulpitis may be caused by dental cavities, trauma, and infection.    HOME CARE:   For toothaches: . Over-the-counter pain medications such as acetaminophen or ibuprofen may be used. Take these as directed on the package while you arrange for a dental appointment. . Avoid very cold or hot foods, because they may make the pain worse. . You may get relief from biting on a cotton ball soaked in oil of cloves. You can get oil of cloves at most drug stores.  For jaw pain: .  Aspirin may be helpful for problems in the joint of the jaw in adults. . If pain happens every time you open your mouth widely, the temporomandibular joint (TMJ) may be the source of the pain. Yawning or taking a large bite of food may worsen the pain. An appointment with your doctor or dentist will help you  find the cause.     GET HELP RIGHT AWAY IF:  . You have a high fever or chills . If you have had a recent head or face injury and develop headache, light headedness, nausea, vomiting, or other symptoms that concern you after an injury to your face or mouth, you could have a more serious injury in addition to your dental injury. . A facial rash associated with a toothache: This condition may improve with medication. Contact your doctor for them to decide what is appropriate. . Any jaw pain occurring with chest pain: Although jaw pain is most commonly caused by dental disease, it is sometimes referred pain from other areas. People with heart disease, especially people who have had stents placed, people with diabetes, or those who have had heart surgery may have jaw pain as a symptom of heart attack or angina. If your jaw or tooth pain is associated with lightheadedness, sweating, or shortness of breath, you should see a doctor as soon as possible. . Trouble swallowing or excessive pain or bleeding from gums: If you have a history of a weakened immune system, diabetes, or steroid use, you may be more susceptible to infections. Infections can often be more severe and extensive or caused by unusual organisms. Dental and gum infections in people with these conditions may require more aggressive treatment. An abscess may need draining  or IV antibiotics, for example.  MAKE SURE YOU    Understand these instructions.  Will watch your condition.  Will get help right away if you are not doing well or get worse.  Thank you for choosing an e-visit. Your e-visit answers were reviewed by a board certified advanced clinical practitioner to complete your personal care plan. Depending upon the condition, your plan could have included both over the counter or prescription medications. Please review your pharmacy choice. Make sure the pharmacy is open so you can pick up prescription now. If there is a problem, you  may contact your provider through [employer] and have the prescription routed to another pharmacy. Your safety is important to [place]. If you have drug allergies check your prescription carefully.  For the next 24 hours you can use MyChart to ask questions about today's visit, request a non-urgent call back, or ask for a work or school excuse. You will get an email in the next two days asking about your experience. I hope that your e-visit has been valuable and will speed your recovery.  I have spent at least 5 minutes reviewing and documenting in the patient's chart.

## 2020-07-23 ENCOUNTER — Other Ambulatory Visit: Payer: Self-pay | Admitting: Podiatry

## 2020-07-23 NOTE — Telephone Encounter (Signed)
Please advise 

## 2020-07-26 ENCOUNTER — Encounter: Payer: 59 | Admitting: Physical Medicine and Rehabilitation

## 2020-08-15 ENCOUNTER — Telehealth (HOSPITAL_COMMUNITY): Payer: No Payment, Other | Admitting: Psychiatry

## 2020-08-15 ENCOUNTER — Telehealth: Payer: Self-pay | Admitting: *Deleted

## 2020-08-15 NOTE — Telephone Encounter (Signed)
PT SCHEDULED FOR A TELEVISIT PV AT 11 AM TODAY FOR A ECL WITH DR JACOBS 5-10 Tuesday - I CALLED PT AT 1050- NO ANSWER, WENT TO VOICE MAIL THAT IDENTIFIES PT AS Billy Mann- MAIL BOX FULL UNABLE TO LEAVE MESSAGE-- CALLED AGAIN AT 1103 AM , SAME NO ANSWER, MAIL BOX FULL- CALLED AT 1105, SAME NO ANSWER, MAIL BOX FULL - CALLED AGAIN AT 1110 AM - VOICE MAIL AND MAIL BOX IS FULL -   UNFORTUNATELY SINCE HE DID NOT ANSWER THE CALL MULTIPLE TIMES , SENT A NS LETTER AND CANCELED ECL 5-10 AS SCHEDULED AND PT WILL HAVE TO CB TO RS

## 2020-08-20 ENCOUNTER — Telehealth (HOSPITAL_COMMUNITY): Payer: 59 | Admitting: Psychiatry

## 2020-08-20 ENCOUNTER — Other Ambulatory Visit: Payer: Self-pay

## 2020-08-21 ENCOUNTER — Ambulatory Visit (HOSPITAL_COMMUNITY): Payer: No Payment, Other | Admitting: Licensed Clinical Social Worker

## 2020-08-26 ENCOUNTER — Telehealth: Payer: 59 | Admitting: Emergency Medicine

## 2020-08-26 DIAGNOSIS — R059 Cough, unspecified: Secondary | ICD-10-CM | POA: Diagnosis not present

## 2020-08-26 MED ORDER — BENZONATATE 100 MG PO CAPS: 100.0000 mg | ORAL_CAPSULE | Freq: Two times a day (BID) | ORAL | 0 refills | Status: DC | PRN

## 2020-08-26 NOTE — Progress Notes (Signed)
We are sorry that you are not feeling well.  Here is how we plan to help!  Based on your presentation I believe you most likely have A cough due to a virus.  This is called viral bronchitis and is best treated by rest, plenty of fluids and control of the cough.  You may use Ibuprofen or Tylenol as directed to help your symptoms.     In addition you may use A prescription cough medication called Tessalon Perles 100mg. You may take 1-2 capsules every 8 hours as needed for your cough.    From your responses in the eVisit questionnaire you describe inflammation in the upper respiratory tract which is causing a significant cough.  This is commonly called Bronchitis and has four common causes:    Allergies  Viral Infections  Acid Reflux  Bacterial Infection Allergies, viruses and acid reflux are treated by controlling symptoms or eliminating the cause. An example might be a cough caused by taking certain blood pressure medications. You stop the cough by changing the medication. Another example might be a cough caused by acid reflux. Controlling the reflux helps control the cough.  USE OF BRONCHODILATOR ("RESCUE") INHALERS: There is a risk from using your bronchodilator too frequently.  The risk is that over-reliance on a medication which only relaxes the muscles surrounding the breathing tubes can reduce the effectiveness of medications prescribed to reduce swelling and congestion of the tubes themselves.  Although you feel brief relief from the bronchodilator inhaler, your asthma may actually be worsening with the tubes becoming more swollen and filled with mucus.  This can delay other crucial treatments, such as oral steroid medications. If you need to use a bronchodilator inhaler daily, several times per day, you should discuss this with your provider.  There are probably better treatments that could be used to keep your asthma under control.     HOME CARE . Only take medications as instructed by  your medical team. . Complete the entire course of an antibiotic. . Drink plenty of fluids and get plenty of rest. . Avoid close contacts especially the very young and the elderly . Cover your mouth if you cough or cough into your sleeve. . Always remember to wash your hands . A steam or ultrasonic humidifier can help congestion.   GET HELP RIGHT AWAY IF: . You develop worsening fever. . You become short of breath . You cough up blood. . Your symptoms persist after you have completed your treatment plan MAKE SURE YOU   Understand these instructions.  Will watch your condition.  Will get help right away if you are not doing well or get worse.  Your e-visit answers were reviewed by a board certified advanced clinical practitioner to complete your personal care plan.  Depending on the condition, your plan could have included both over the counter or prescription medications. If there is a problem please reply  once you have received a response from your provider. Your safety is important to us.  If you have drug allergies check your prescription carefully.    You can use MyChart to ask questions about today's visit, request a non-urgent call back, or ask for a work or school excuse for 24 hours related to this e-Visit. If it has been greater than 24 hours you will need to follow up with your provider, or enter a new e-Visit to address those concerns. You will get an e-mail in the next two days asking about your experience.    I hope that your e-visit has been valuable and will speed your recovery. Thank you for using e-visits.  Approximately 5 minutes was used in reviewing the patient's chart, questionnaire, prescribing medications, and documentation.  

## 2020-08-27 ENCOUNTER — Encounter: Payer: 59 | Admitting: Gastroenterology

## 2020-09-01 ENCOUNTER — Telehealth: Payer: 59 | Admitting: Physician Assistant

## 2020-09-01 DIAGNOSIS — J208 Acute bronchitis due to other specified organisms: Secondary | ICD-10-CM

## 2020-09-01 DIAGNOSIS — B9689 Other specified bacterial agents as the cause of diseases classified elsewhere: Secondary | ICD-10-CM

## 2020-09-01 MED ORDER — AZITHROMYCIN 250 MG PO TABS: ORAL_TABLET | ORAL | 0 refills | Status: AC

## 2020-09-01 MED ORDER — PROMETHAZINE-DM 6.25-15 MG/5ML PO SYRP: 5.0000 mL | ORAL_SOLUTION | Freq: Four times a day (QID) | ORAL | 0 refills | Status: DC | PRN

## 2020-09-01 NOTE — Progress Notes (Signed)
I have spent 5 minutes in review of e-visit questionnaire, review and updating patient chart, medical decision making and response to patient.   Logen Heintzelman Cody Bristol Soy, PA-C    

## 2020-09-01 NOTE — Progress Notes (Signed)
We are sorry that you are not feeling well.  Here is how we plan to help!  Based on your presentation I believe you most likely have A cough due to bacteria.  When patients have a productive cough with a change in color or increased sputum production, we are concerned about bacterial bronchitis.  If left untreated it can progress to pneumonia.  If your symptoms do not improve with your treatment plan it is important that you contact your provider.   I have prescribed Azithromyin 250 mg: two tablets now and then one tablet daily for 4 additonal days. I have also sent in a prescription cough syrup to help with the cough itself - promethazine-dm.   From your responses in the eVisit questionnaire you describe inflammation in the upper respiratory tract which is causing a significant cough.  This is commonly called Bronchitis and has four common causes:    Allergies  Viral Infections  Acid Reflux  Bacterial Infection Allergies, viruses and acid reflux are treated by controlling symptoms or eliminating the cause. An example might be a cough caused by taking certain blood pressure medications. You stop the cough by changing the medication. Another example might be a cough caused by acid reflux. Controlling the reflux helps control the cough.  USE OF BRONCHODILATOR ("RESCUE") INHALERS: There is a risk from using your bronchodilator too frequently.  The risk is that over-reliance on a medication which only relaxes the muscles surrounding the breathing tubes can reduce the effectiveness of medications prescribed to reduce swelling and congestion of the tubes themselves.  Although you feel brief relief from the bronchodilator inhaler, your asthma may actually be worsening with the tubes becoming more swollen and filled with mucus.  This can delay other crucial treatments, such as oral steroid medications. If you need to use a bronchodilator inhaler daily, several times per day, you should discuss this with your  provider.  There are probably better treatments that could be used to keep your asthma under control.     HOME CARE . Only take medications as instructed by your medical team. . Complete the entire course of an antibiotic. . Drink plenty of fluids and get plenty of rest. . Avoid close contacts especially the very young and the elderly . Cover your mouth if you cough or cough into your sleeve. . Always remember to wash your hands . A steam or ultrasonic humidifier can help congestion.   GET HELP RIGHT AWAY IF: . You develop worsening fever. . You become short of breath . You cough up blood. . Your symptoms persist after you have completed your treatment plan MAKE SURE YOU   Understand these instructions.  Will watch your condition.  Will get help right away if you are not doing well or get worse.  Your e-visit answers were reviewed by a board certified advanced clinical practitioner to complete your personal care plan.  Depending on the condition, your plan could have included both over the counter or prescription medications. If there is a problem please reply  once you have received a response from your provider. Your safety is important to [place].  If you have drug allergies check your prescription carefully.    You can use MyChart to ask questions about today's visit, request a non-urgent call back, or ask for a work or school excuse for 24 hours related to this e-Visit. If it has been greater than 24 hours you will need to follow up with your provider, or enter  a new e-Visit to address those concerns. You will get an e-mail in the next two days asking about your experience.  I hope that your e-visit has been valuable and will speed your recovery. Thank you for using e-visits.

## 2020-09-10 ENCOUNTER — Telehealth: Payer: 59 | Admitting: Nurse Practitioner

## 2020-09-10 ENCOUNTER — Encounter: Payer: Self-pay | Admitting: Nurse Practitioner

## 2020-09-10 ENCOUNTER — Ambulatory Visit: Payer: 59 | Admitting: Internal Medicine

## 2020-09-10 DIAGNOSIS — R300 Dysuria: Secondary | ICD-10-CM | POA: Diagnosis not present

## 2020-09-10 MED ORDER — SULFAMETHOXAZOLE-TRIMETHOPRIM 800-160 MG PO TABS: 1.0000 | ORAL_TABLET | Freq: Two times a day (BID) | ORAL | 0 refills | Status: AC

## 2020-09-10 NOTE — Progress Notes (Signed)
We are sorry that you are not feeling well.  Here is how we plan to help!  Based on what you shared with me it looks like you may have a urinary tract infection.  In men this is rare as we discussed, we recommend further testing to verify that this is a UTI, and to rule out STI/STD infections.     A UTI (Urinary Tract Infection) is a bacterial infection of the bladder.  Most cases of urinary tract infections are simple to treat but a key part of your care is to encourage you to drink plenty of fluids and watch your symptoms carefully.  I have prescribed Bactrim DS One tablet twice a day for 5 days.  Your symptoms should gradually improve. Call us if the burning in your urine worsens, you develop worsening fever, back pain or pelvic pain or if your symptoms do not resolve after completing the antibiotic.  Urinary tract infections can be prevented by drinking plenty of water to keep your body hydrated.  Also be sure when you wipe, wipe from front to back and don't hold it in!  If possible, empty your bladder every 4 hours.  Your e-visit answers were reviewed by a board certified advanced clinical practitioner to complete your personal care plan.  Depending on the condition, your plan could have included both over the counter or prescription medications.  If there is a problem please reply  once you have received a response from your provider.  Your safety is important to [place].  If you have drug allergies check your prescription carefully.    You can use MyChart to ask questions about today's visit, request a non-urgent call back, or ask for a work or school excuse for 24 hours related to this e-Visit. If it has been greater than 24 hours you will need to follow up with your provider, or enter a new e-Visit to address those concerns.   You will get an e-mail in the next two days asking about your experience.  I hope that your e-visit has been valuable and will speed your recovery. Thank you for  using e-visits.   I spent 15 minutes communicating back and forth with patient, reviewing the history and planning for care including explaining the nature of this visit and recommending further testing.  Patient is agreeable to get STI testing and further evaluation. Will start antibiotic as he has had UTIs in the past but understands the importance of testing.    Meds ordered this encounter  Medications  . sulfamethoxazole-trimethoprim (BACTRIM DS) 800-160 MG tablet    Sig: Take 1 tablet by mouth 2 (two) times daily for 5 days.    Dispense:  10 tablet    Refill:  0

## 2020-09-19 ENCOUNTER — Other Ambulatory Visit (HOSPITAL_COMMUNITY): Payer: Self-pay | Admitting: Psychiatry

## 2020-09-19 DIAGNOSIS — F323 Major depressive disorder, single episode, severe with psychotic features: Secondary | ICD-10-CM

## 2020-10-02 ENCOUNTER — Telehealth (HOSPITAL_COMMUNITY): Payer: 59 | Admitting: Psychiatry

## 2020-10-02 ENCOUNTER — Other Ambulatory Visit: Payer: Self-pay

## 2020-10-15 ENCOUNTER — Ambulatory Visit (HOSPITAL_COMMUNITY): Payer: No Payment, Other | Admitting: Licensed Clinical Social Worker

## 2020-10-22 ENCOUNTER — Other Ambulatory Visit: Payer: Self-pay | Admitting: Nurse Practitioner

## 2020-11-27 ENCOUNTER — Other Ambulatory Visit: Payer: Self-pay | Admitting: Nurse Practitioner

## 2020-11-27 ENCOUNTER — Other Ambulatory Visit (HOSPITAL_COMMUNITY): Payer: Self-pay | Admitting: Psychiatry

## 2020-11-27 DIAGNOSIS — F323 Major depressive disorder, single episode, severe with psychotic features: Secondary | ICD-10-CM

## 2020-12-02 ENCOUNTER — Ambulatory Visit: Payer: Self-pay | Admitting: Podiatry

## 2020-12-09 ENCOUNTER — Ambulatory Visit: Payer: 59 | Admitting: Podiatry

## 2021-01-10 ENCOUNTER — Telehealth: Payer: 59 | Admitting: Nurse Practitioner

## 2021-01-10 ENCOUNTER — Other Ambulatory Visit (HOSPITAL_COMMUNITY): Payer: Self-pay | Admitting: Psychiatry

## 2021-01-10 ENCOUNTER — Other Ambulatory Visit: Payer: Self-pay | Admitting: Nurse Practitioner

## 2021-01-10 DIAGNOSIS — K649 Unspecified hemorrhoids: Secondary | ICD-10-CM

## 2021-01-10 DIAGNOSIS — F411 Generalized anxiety disorder: Secondary | ICD-10-CM

## 2021-01-10 DIAGNOSIS — F323 Major depressive disorder, single episode, severe with psychotic features: Secondary | ICD-10-CM

## 2021-01-10 DIAGNOSIS — K5900 Constipation, unspecified: Secondary | ICD-10-CM

## 2021-01-10 DIAGNOSIS — M545 Low back pain, unspecified: Secondary | ICD-10-CM

## 2021-01-10 NOTE — Progress Notes (Signed)
Based on what you shared with me it looks like you have hemorrhoids preventing you from going to the rest room that should be evaluated in a face to face office visit. You need to see some one to decompress the hemorrhoids in  order to hav normal bowel movements.  NOTE: There will be NO CHARGE for this eVisit   If you are having a true medical emergency please call 911.      For an urgent face to face visit, [place] has six urgent care centers for your convenience:     [facility] Health Ur[facility] Care Center at [facility] Directions [phone] [address] [place], [place] [address]    [facility]) Get Driving Directions [phone] [address] [place], [place] [address]  [facility] Health Urgent Care Center [facility]) Get Driving Directions [phone] [address] [place],  [place]  [address]  [facility] Health Urgent Care at [facility] Get Driving Directions [phone] [address] [place], [place] [address]   [facility] Health Urgent Care at [facility] Get Driving Directions  [phone] [address] [place], [place] [address]   [facility] Health Urgent Care at [facility] Directions [phone] [address] [place], [place] [address]  Your MyChart E-visit questionnaire answers were reviewed by a board certified advanced clinical practitioner to complete your personal care plan based on your specific symptoms.  Thank you for using e-Visits.

## 2021-01-21 ENCOUNTER — Telehealth: Payer: 59 | Admitting: Physician Assistant

## 2021-01-21 DIAGNOSIS — M5441 Lumbago with sciatica, right side: Secondary | ICD-10-CM

## 2021-01-21 DIAGNOSIS — M5442 Lumbago with sciatica, left side: Secondary | ICD-10-CM | POA: Diagnosis not present

## 2021-01-21 MED ORDER — NAPROXEN 500 MG PO TABS: 500.0000 mg | ORAL_TABLET | Freq: Two times a day (BID) | ORAL | 0 refills | Status: DC

## 2021-01-21 MED ORDER — CYCLOBENZAPRINE HCL 10 MG PO TABS: 5.0000 mg | ORAL_TABLET | Freq: Three times a day (TID) | ORAL | 0 refills | Status: DC | PRN

## 2021-01-21 NOTE — Progress Notes (Signed)

## 2021-01-29 ENCOUNTER — Telehealth: Payer: 59 | Admitting: Family

## 2021-01-29 ENCOUNTER — Telehealth: Payer: 59 | Admitting: Physician Assistant

## 2021-01-29 DIAGNOSIS — N489 Disorder of penis, unspecified: Secondary | ICD-10-CM

## 2021-01-29 NOTE — Progress Notes (Signed)
Virtual Visit Consent   Billy Mann, you are scheduled for a virtual visit with a [facility] provider today.     Just as with appointments in the office, your consent must be obtained to participate.  Your consent will be active for this visit and any virtual visit you may have with one of our providers in the next 365 days.     If you have a MyChart account, a copy of this consent can be sent to you electronically.  All virtual visits are billed to your insurance company just like a traditional visit in the office.    As this is a virtual visit, video technology does not allow for your provider to perform a traditional examination.  This may limit your provider's ability to fully assess your condition.  If your provider identifies any concerns that need to be evaluated in person or the need to arrange testing (such as labs, EKG, etc.), we will make arrangements to do so.     Although advances in technology are sophisticated, we cannot ensure that it will always work on either your end or our end.  If the connection with a video visit is poor, the visit may have to be switched to a telephone visit.  With either a video or telephone visit, we are not always able to ensure that we have a secure connection.     I need to obtain your verbal consent now.   Are you willing to proceed with your visit today?    Rutherford Alarie has provided verbal consent on 01/29/2021 for a virtual visit (video or telephone).   Jannifer Rodney, FNP   Date: 01/29/2021 7:54 PM   Virtual Visit via Video Note   I, Jannifer Rodney, connected with  Aeden Matranga  ([id number], 24-Jul-1980) on 01/29/21 at  7:45 PM EDT by a video-enabled telemedicine application and verified that I am speaking with the correct person using two identifiers.  Location: Patient: Virtual Visit Location Patient: Home Provider: Virtual Visit Location Provider: Home   I discussed the limitations of evaluation and management by telemedicine  and the availability of in person appointments. The patient expressed understanding and agreed to proceed.    History of Present Illness: Billy Mann is a 40 y.o. who identifies as a male who was assigned male at birth, and is being seen today for bump on the shaft of his penis and pubic area that he noticed yesterday. This morning when he woke up he has on one his right thigh. He denies any fever, pain, itchy, or discharge. He reports the bumps are hard. He reports he gets tested for STD's every 3 months.    Denies any changes in detergent or changes in substances.   He reports he used a "cock ring" last night (has been using the same one for a year now) and after using it he noticed the bumps. He keeps this in his nightstand.  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    Allergies:  Allergies  Allergen Reactions   Penicillins Anaphylaxis   Medications:  Current Outpatient Medications:    atorvastatin (LIPITOR) 20 MG tablet, Take 1 tablet (20  mg total) by mouth daily., Disp: 90 tablet, Rfl: 3   busPIRone (BUSPAR) 10 MG tablet, TAKE 1 TABLET BY MOUTH THREE TIMES DAILY, Disp: 90 tablet, Rfl: 0   cyclobenzaprine (FLEXERIL) 10 MG tablet, Take 0.5-1 tablets (5-10 mg total) by mouth 3 (three) times daily as needed for muscle spasms., Disp: 30 tablet, Rfl: 0   famotidine (PEPCID) 40 MG tablet, Take 1 tablet by mouth once daily, Disp: 30 tablet, Rfl: 3   fluticasone (FLONASE) 50 MCG/ACT nasal spray, Place 2 sprays into both nostrils daily., Disp: 16 g, Rfl: 6   pantoprazole (PROTONIX) 40 MG  tablet, Take 1 tablet (40 mg total) by mouth daily., Disp: 30 tablet, Rfl: 3   sildenafil (VIAGRA) 100 MG tablet, Take 0.5-1 tablets (50-100 mg total) by mouth daily as needed for erectile dysfunction., Disp: 6 tablet, Rfl: 5   traZODone (DESYREL) 50 MG tablet, TAKE 1 TABLET BY MOUTH AT BEDTIME, Disp: 30 tablet, Rfl: 0  Observations/Objective: Patient is well-developed, well-nourished in no acute distress.  Resting comfortably  at home.  Head is normocephalic, atraumatic.  No labored breathing.  Speech is clear and coherent with logical content.  Patient is alert and oriented at baseline.  Crate like lesion on base of penis.   Assessment and Plan: 1. Penile lesion No blisters present, has had STD panel last month that was negative. Only one partner.  Given it happened after "cock ring", I suspect this is an allergy reaction to some chemical that it came in contact with in his night stand.  Continue to watch area, if worsens will need to be seen face to face for area to be swabbed.  No fever or flu like symptoms- Doubt monkey pox Avoid scratching and picky   Follow Up Instructions: I discussed the assessment and treatment plan with the patient. The patient was provided an opportunity to ask questions and all were answered. The patient agreed with the plan and demonstrated an understanding of the instructions.  A copy of instructions were sent to the patient via MyChart unless otherwise noted below.     The patient was advised to call back or seek an in-person evaluation if the symptoms worsen or if the condition fails to improve as anticipated.  Time:  I spent 18 minutes with the patient via telehealth technology discussing the above problems/concerns.    Jannifer Rodney, FNP

## 2021-01-29 NOTE — Progress Notes (Signed)
Based on what you shared with me, I feel your condition warrants further evaluation and I recommend that you be seen in a face to face visit.  Giving penile lesions you need an assessment in person for detailed exam and testing to further assess cause and so proper treatment can be given.    NOTE: There will be NO CHARGE for this eVisit   If you are having a true medical emergency please call 911.      For an urgent face to face visit, [facility] has six urgent care centers for your convenience:     [facility] Health Urgent Care Center at [facility] Directions [phone] [address] [place], [place] [address]    [facility] Health Urgent Care Center [facility]) Get Driving Directions [phone] [address] [place], [place] [address]  [facility] Health Urgent Care Center [facility]) Get Driving Directions [phone] [address] [place],  [place]  [address]  [facility] Health Urgent Care at [facility] Get Driving Directions [phone] [address] [place], [place] [address]   [facility] Urgent Care at [facility] Get Driving Directions  [phone] [address] [place], [place] [address]   [facility] Health Urgent Care at [facility] Directions [phone] [address] [place], [place] [address]  Your MyChart E-visit questionnaire answers were reviewed by a board certified advanced clinical practitioner to complete your personal care plan based on your specific symptoms.  Thank you for using e-Visits.

## 2021-01-31 ENCOUNTER — Ambulatory Visit: Payer: 59

## 2021-01-31 ENCOUNTER — Ambulatory Visit
Admission: RE | Admit: 2021-01-31 | Discharge: 2021-01-31 | Disposition: A | Discharge status: Home / Self Care | Payer: 59 | Source: Ambulatory Visit | Attending: Physician Assistant | Admitting: Physician Assistant

## 2021-01-31 ENCOUNTER — Other Ambulatory Visit: Payer: Self-pay

## 2021-01-31 VITALS — BP 138/81 | HR 102 | Temp 99.6°F | Resp 18

## 2021-01-31 DIAGNOSIS — N5089 Other specified disorders of the male genital organs: Secondary | ICD-10-CM

## 2021-01-31 LAB — POCT URINALYSIS DIP (MANUAL ENTRY)
Bilirubin, UA: NEGATIVE
Blood, UA: NEGATIVE
Glucose, UA: NEGATIVE mg/dL
Ketones, POC UA: NEGATIVE mg/dL
Nitrite, UA: NEGATIVE
Protein Ur, POC: NEGATIVE mg/dL
Spec Grav, UA: 1.025 (ref 1.010–1.025)
Urobilinogen, UA: 0.2 E.U./dL
pH, UA: 5.5 (ref 5.0–8.0)

## 2021-01-31 NOTE — ED Provider Notes (Signed)
[facility]    CSN: [id number] Arrival date & time: 01/31/21  1745      History   Chief Complaint Chief Complaint  Patient presents with   6p appointment   genital lesions    HPI Billy Mann is a 40 y.o. male.   Patient here today for evaluation of genital lesions that he first noted about 4 days ago.  He reports that he thought it was due to a "cock ring" that he had used because he had his first lesion appeared right after sexual intercourse using said device.  He does have sex with another male who accompanies him today.  Partner has not had any lesions that he is aware of, but does report some anal itching recently but figured this was secondary to hemorrhoid.  Patient has had fever and chills.  He also endorses headache and body aches.  He has history of kidney failure.  Upon further questioning it is possible that the "cock ring" was used by multiple people in the household.  Patient currently lives with his ex-boyfriend who is HIV positive, and he states he might of used it as well as "anyone else in the house".  He reports concerns that it might be being used by others because he does not use lubricant but when he has tried to use the device recently he has noted it will seem to be "lubed up".  He has tried over-the-counter treatment for fever which has helped somewhat.  The history is provided by the patient.   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 Authorizing Provider  atorvastatin (LIPITOR) 20 MG tablet Take 1 tablet (20 mg total) by mouth daily. 04/09/20   Arvilla Market, MD  busPIRone (BUSPAR) 10 MG tablet TAKE 1 TABLET BY MOUTH THREE TIMES DAILY 01/13/21   Shanna Cisco, NP  cyclobenzaprine (FLEXERIL) 10 MG tablet Take 0.5-1 tablets (5-10 mg total) by mouth 3 (three) times daily as needed for muscle spasms. 01/21/21   Margaretann Loveless, PA-C  famotidine (PEPCID) 40 MG tablet Take 1 tablet by mouth once daily 01/10/21   Arnaldo Natal, NP  fluticasone (FLONASE) 50 MCG/ACT nasal spray Place 2 sprays into both nostrils daily. 04/21/20   Jannifer Rodney A, FNP  pantoprazole (PROTONIX) 40 MG tablet Take 1 tablet (40 mg total) by mouth daily. 06/10/20   Arnaldo Natal, NP  sildenafil (VIAGRA) 100 MG tablet Take 0.5-1 tablets (50-100 mg total) by  mouth daily as needed for erectile dysfunction. 06/19/20   Arvilla Market, MD  traZODone (DESYREL) 50 MG tablet TAKE 1 TABLET BY MOUTH AT BEDTIME 01/13/21   Shanna Cisco, NP    Family History Family History  Problem Relation Age of Onset   Heart disease Mother    Heart disease Father    Diabetes Paternal Grandmother     Social History Social History   Tobacco Use   Smoking status: Every Day    Types: Cigarettes   Smokeless tobacco: Never  Vaping Use   Vaping Use: Never used  Substance Use Topics   Alcohol use: Never   Drug use: Yes    Types: Methamphetamines     Allergies    Penicillins   Review of Systems Review of Systems  Constitutional:  Positive for chills and fever.  Eyes:  Negative for discharge and redness.  Respiratory:  Negative for shortness of breath.   Gastrointestinal:  Negative for nausea and vomiting.  Genitourinary:  Positive for flank pain and genital sores.  Skin:  Positive for rash.    Physical Exam Triage Vital Signs ED Triage Vitals  Enc Vitals Group     BP 01/31/21 1754 138/81     Pulse Rate 01/31/21 1754 (!) 102     Resp 01/31/21 1754 18     Temp 01/31/21 1754 99.6 F (37.6 C)     Temp Source 01/31/21 1754 Oral     SpO2 01/31/21 1754 99 %     Weight --      Height --      Head Circumference --      Peak Flow --      Pain Score 01/31/21 1758 10     Pain Loc --      Pain Edu? --      Excl. in GC? --    No data found.  Updated Vital Signs BP 138/81 (BP Location: Left Arm)   Pulse (!) 102   Temp 99.6 F (37.6 C) (Oral)   Resp 18   SpO2 99%      Physical Exam Vitals and nursing note reviewed.  Constitutional:      General: He is not in acute distress.    Appearance: Normal appearance. He is not ill-appearing.  HENT:     Head: Normocephalic and atraumatic.  Eyes:     Conjunctiva/sclera: Conjunctivae normal.  Cardiovascular:     Rate and Rhythm: Normal rate.  Pulmonary:     Effort: Pulmonary effort is normal.  Genitourinary:    Comments: Multiple umbilicated lesions to base of penis with central hyperpigmentation/ scabbing Neurological:     Mental Status: He is alert.  Psychiatric:        Mood and Affect: Mood normal.        Behavior: Behavior normal.     UC Treatments / Results  Labs (all labs ordered are listed, but only abnormal results are displayed) Labs Reviewed  POCT URINALYSIS DIP (MANUAL ENTRY) - Abnormal; Notable for the following components:      Result Value   Leukocytes, UA Trace (*)    All other components within normal limits  MONKEYPOX VIRUS DNA, QUALITATIVE REAL-TIME PCR  HSV  CULTURE AND TYPING  CULTURE, ROUTINE-GENITAL  CBC WITH DIFFERENTIAL/PLATELET  COMPREHENSIVE METABOLIC PANEL  HIV ANTIBODY (ROUTINE TESTING W REFLEX)  HEPATITIS PANEL, ACUTE  RPR    EKG   Radiology No results found.  Procedures Procedures (including critical care time)  Medications Ordered in  UC Medications - No data to display  Initial Impression / Assessment and Plan / UC Course  I have reviewed the triage vital signs and the nursing notes.  Pertinent labs & imaging results that were available during my care of the patient were reviewed by me and considered in my medical decision making (see chart for details).   Will screen for monkeypox, herpes, other bacterial cause but discussed that rash appears most consistent with monkeypox as do his symptoms of fever, body aches, etc. Advised to notify anyone who might have come into contact with "cock ring" and recommended follow up with any concerns. Advised abstinence until lesions have cleared to reduce risk of further spread. Continue symptomatic treatment. Routine labs ordered for further STD screening, etc.   Final Clinical Impressions(s) / UC Diagnoses   Final diagnoses:  Genital lesion, male     Discharge Instructions      Check MyChart for results. Will call with any positive findings.      ED Prescriptions   None    PDMP not reviewed this encounter.   Tomi Bamberger, PA-C 01/31/21 [phone]

## 2021-01-31 NOTE — Discharge Instructions (Addendum)
Check MyChart for results. Will call with any positive findings.

## 2021-01-31 NOTE — ED Triage Notes (Addendum)
4 days ago while having intercourse w/male partner, Pt reports that he was wearing a penis ring and noticed an onset of pain. Later, Pt reports one, red, raised, pruritic lesion on his penis. The next day, he reports four more lesions appeared. The lesions have a hole in the center, with red boarders and white central. He c/o right flank pain and pain with walking. Lesions are increasing in size. Warm water increases itching. Has been taking ibuprofen and naproxen without relief. Pt's partner suspects parasites noting home cats may have gotten a hold of the ring.   Pt also has right hand pain and numbness after punching an item.

## 2021-02-01 LAB — CBC WITH DIFFERENTIAL/PLATELET
Basophils Absolute: 0 10*3/uL (ref 0.0–0.2)
Basos: 1 %
EOS (ABSOLUTE): 0.2 10*3/uL (ref 0.0–0.4)
Eos: 3 %
Hematocrit: 37.5 % (ref 37.5–51.0)
Hemoglobin: 13.4 g/dL (ref 13.0–17.7)
Immature Grans (Abs): 0 10*3/uL (ref 0.0–0.1)
Immature Granulocytes: 0 %
Lymphocytes Absolute: 1 10*3/uL (ref 0.7–3.1)
Lymphs: 19 %
MCH: 32.2 pg (ref 26.6–33.0)
MCHC: 35.7 g/dL (ref 31.5–35.7)
MCV: 90 fL (ref 79–97)
Monocytes Absolute: 0.7 10*3/uL (ref 0.1–0.9)
Monocytes: 13 %
Neutrophils Absolute: 3.4 10*3/uL (ref 1.4–7.0)
Neutrophils: 64 %
Platelets: 163 10*3/uL (ref 150–450)
RBC: 4.16 x10E6/uL (ref 4.14–5.80)
RDW: 12.6 % (ref 11.6–15.4)
WBC: 5.4 10*3/uL (ref 3.4–10.8)

## 2021-02-01 LAB — COMPREHENSIVE METABOLIC PANEL
ALT: 28 IU/L (ref 0–44)
AST: 24 IU/L (ref 0–40)
Albumin/Globulin Ratio: 2.1 (ref 1.2–2.2)
Albumin: 4.5 g/dL (ref 4.0–5.0)
Alkaline Phosphatase: 94 IU/L (ref 44–121)
BUN/Creatinine Ratio: 18 (ref 9–20)
BUN: 19 mg/dL (ref 6–24)
Bilirubin Total: 0.5 mg/dL (ref 0.0–1.2)
CO2: 17 mmol/L — ABNORMAL LOW (ref 20–29)
Calcium: 9.2 mg/dL (ref 8.7–10.2)
Chloride: 104 mmol/L (ref 96–106)
Creatinine, Ser: 1.08 mg/dL (ref 0.76–1.27)
Globulin, Total: 2.1 g/dL (ref 1.5–4.5)
Glucose: 114 mg/dL — ABNORMAL HIGH (ref 70–99)
Potassium: 4.5 mmol/L (ref 3.5–5.2)
Sodium: 136 mmol/L (ref 134–144)
Total Protein: 6.6 g/dL (ref 6.0–8.5)
eGFR: 89 mL/min/{1.73_m2} (ref >59)

## 2021-02-01 LAB — RPR: RPR Ser Ql: NONREACTIVE

## 2021-02-01 LAB — HIV ANTIBODY (ROUTINE TESTING W REFLEX): HIV Screen 4th Generation wRfx: NONREACTIVE

## 2021-02-04 ENCOUNTER — Other Ambulatory Visit: Payer: Self-pay | Admitting: Physician Assistant

## 2021-02-04 DIAGNOSIS — M5442 Lumbago with sciatica, left side: Secondary | ICD-10-CM

## 2021-02-04 DIAGNOSIS — M5441 Lumbago with sciatica, right side: Secondary | ICD-10-CM

## 2021-02-04 LAB — HSV CULTURE AND TYPING

## 2021-02-04 LAB — MONKEYPOX VIRUS DNA, QUALITATIVE REAL-TIME PCR: Orthopoxvirus DNA, QL PCR: DETECTED — AB

## 2021-02-05 ENCOUNTER — Telehealth (HOSPITAL_COMMUNITY): Payer: Self-pay | Admitting: Internal Medicine

## 2021-02-05 LAB — CULTURE, ROUTINE-GENITAL

## 2021-02-05 NOTE — Telephone Encounter (Signed)
Patient informed of lab results Amb referral to Infectious disease completed Health Dept contacted.

## 2021-02-06 ENCOUNTER — Other Ambulatory Visit: Payer: Self-pay

## 2021-02-06 ENCOUNTER — Ambulatory Visit (INDEPENDENT_AMBULATORY_CARE_PROVIDER_SITE_OTHER): Payer: 59 | Admitting: Physician Assistant

## 2021-02-06 ENCOUNTER — Encounter: Payer: Self-pay | Admitting: Physician Assistant

## 2021-02-06 ENCOUNTER — Other Ambulatory Visit (HOSPITAL_COMMUNITY): Payer: Self-pay

## 2021-02-06 VITALS — BP 129/92 | HR 85 | Temp 98.7°F | Resp 16 | Wt 173.0 lb

## 2021-02-06 DIAGNOSIS — Z79899 Other long term (current) drug therapy: Secondary | ICD-10-CM | POA: Diagnosis not present

## 2021-02-06 DIAGNOSIS — L03818 Cellulitis of other sites: Secondary | ICD-10-CM | POA: Diagnosis not present

## 2021-02-06 DIAGNOSIS — B04 Monkeypox: Secondary | ICD-10-CM

## 2021-02-06 MED ORDER — MUPIROCIN CALCIUM 2 % EX CREA: 1.0000 "application " | TOPICAL_CREAM | Freq: Two times a day (BID) | CUTANEOUS | 0 refills | Status: DC

## 2021-02-06 MED ORDER — TPOXX 200 MG PO CAPS: 600.0000 mg | ORAL_CAPSULE | Freq: Two times a day (BID) | ORAL | 0 refills | Status: AC

## 2021-02-06 NOTE — Progress Notes (Signed)
Subjective:    Patient ID: Billy Mann, male    DOB: August 16, 1980, 40 y.o.   MRN: [id number]  Chief Complaint  Patient presents with   Rash    Confirmed HMPX 01/31/21-seeking TPOXX treatment     HPI:  Billy Mann is a 40 y.o. male presents with confirmed HMPX by urgent care 01/31/21.  Onset of symptoms 01/27/21. He is MSM and has a current male partner whom he is living with, no recent history of anonymous sex or travel to endemic regions, several friends have confirmed MPX.  He has not received Jynneos vaccine.  At onset, initially  reported flu like symptoms of LAD, bodyaches, fever, chills, sore throat that would wax and wane then subsequent lesions developed on penis and scrotum.  Lesions appeared pustular and evolved to vesicular with scabbing, and are painful with surrounding redness.  Inguinal LAD is painful and swollen bilaterally in inguinal region. Observed 2 new lesions develop on penis yesterday.  He has been isolating at home.  However he lives with ex boyfriend who is HIV positive-non adherent to ART regimen, and his current boyfriend (complained of rectal pain and bleeding 1 week prior to pts symptoms)   Pt recently underwent full STI screening via tele PrEP service "[facility]" he receives Truvada daily and is adherent and tolerating well.  He is interested in Apretude and would like to discuss with our clinical pharmacist.  ED labs 01/31/21-RPR non reactive, HIV 4th gen non reactive, HSV negative. Unavailable GC/Chlamydia labs however were negative per history done by teleprep PrEP virtual provider within the last week.   Allergies  Allergen Reactions   Penicillins Anaphylaxis      Outpatient Medications Prior to Visit  Medication Sig Dispense Refill   atorvastatin (LIPITOR) 20 MG tablet Take 1 tablet (20 mg total) by mouth daily. 90 tablet 3   busPIRone (BUSPAR) 10 MG tablet TAKE 1 TABLET BY MOUTH THREE TIMES DAILY 90 tablet 0   cyclobenzaprine (FLEXERIL) 10 MG  tablet Take 0.5-1 tablets (5-10 mg total) by mouth 3 (three) times daily as needed for muscle spasms. 30 tablet 0   famotidine (PEPCID) 40 MG tablet Take 1 tablet by mouth once daily 30 tablet 3   fluticasone (FLONASE) 50 MCG/ACT nasal spray Place 2 sprays into both nostrils daily. 16 g 6   pantoprazole (PROTONIX) 40 MG tablet Take 1 tablet (40 mg total) by mouth daily. 30 tablet 3   sildenafil (VIAGRA) 100 MG tablet Take 0.5-1 tablets (50-100 mg total) by mouth daily as needed for erectile dysfunction. 6 tablet 5   traZODone (DESYREL) 50 MG tablet TAKE 1 TABLET BY MOUTH AT BEDTIME 30 tablet 0   No facility-administered medications prior to visit.     Past Medical History:  Diagnosis Date   Anxiety    Phreesia 03/30/2020   Anxiety    Depression    Phreesia 03/30/2020   Depression    GERD (gastroesophageal reflux disease)    Phreesia 03/30/2020   High cholesterol    Hyperlipidemia    Phreesia 03/30/2020   Hypertension    Phreesia 03/30/2020   Sleep apnea    Phreesia 03/30/2020     Past Surgical History:  Procedure Laterality Date   FOOT SURGERY     bilateral hammer toe   TONSILLECTOMY         Review of Systems  Constitutional:  Positive for fatigue. Negative for activity change, appetite change, chills, diaphoresis and fever.  HENT:  Negative for mouth sores  and sore throat.   Eyes:  Negative for visual disturbance.  Respiratory:  Negative for cough, shortness of breath and wheezing.   Cardiovascular:  Negative for chest pain and palpitations.  Gastrointestinal:  Negative for abdominal pain, anal bleeding, blood in stool, constipation, diarrhea, nausea, rectal pain and vomiting.  Genitourinary:  Positive for genital sores (painful). Negative for difficulty urinating, flank pain, hematuria, penile discharge, penile pain, penile swelling, scrotal swelling, testicular pain and urgency.  Musculoskeletal:  Positive for gait problem (due to pain). Negative for arthralgias,  back pain and myalgias.  Skin:  Positive for rash.  Allergic/Immunologic: Negative for immunocompromised state.  Neurological:  Negative for weakness, light-headedness and headaches.  Hematological:  Positive for adenopathy (bilateraly painful inguinal LAD).  Psychiatric/Behavioral: Negative.       Objective:    BP (!) 129/92   Pulse 85   Temp 98.7 F (37.1 C)   Resp 16   Wt 173 lb (78.5 kg)   BMI 24.82 kg/m  Nursing note and vital signs reviewed.  Physical Exam Vitals reviewed.  Constitutional:      General: He is not in acute distress.    Appearance: Normal appearance. He is normal weight. He is not ill-appearing.  HENT:     Head: Normocephalic and atraumatic.     Nose: Nose normal.     Mouth/Throat:     Mouth: Mucous membranes are moist.     Pharynx: Oropharynx is clear. No oropharyngeal exudate or posterior oropharyngeal erythema.  Eyes:     Extraocular Movements: Extraocular movements intact.     Conjunctiva/sclera: Conjunctivae normal.     Pupils: Pupils are equal, round, and reactive to light.  Cardiovascular:     Rate and Rhythm: Normal rate and regular rhythm.  Pulmonary:     Effort: Pulmonary effort is normal.     Breath sounds: Normal breath sounds.  Genitourinary:    Penis: Circumcised.      Testes: Normal.  Musculoskeletal:     Cervical back: Normal range of motion and neck supple.  Lymphadenopathy:     Cervical: No cervical adenopathy.     Lower Body: Right inguinal adenopathy present. Left inguinal adenopathy present.  Skin:    General: Skin is warm.     Findings: Lesion and rash present. Rash is crusting, pustular and vesicular.     Comments: Shaft of penis, scrotum 8 lesions total  pustular x 2, erythematous margins, nttp-appear new Crusted over lesions 4, erythematous margins, edematous, + ttp, 1 cm diameter, deep seated 2 vesicular lesions on penile shaft 8 mm diameter, nttp, erythematous margins  Neurological:     General: No focal deficit  present.     Mental Status: He is alert and oriented to person, place, and time.  Psychiatric:        Mood and Affect: Mood normal.        Behavior: Behavior normal.        Thought Content: Thought content normal.        Judgment: Judgment normal.     Depression screen [facility] 2/9 05/30/2020 05/30/2020 05/21/2020  Decreased Interest 0 0 0  Down, Depressed, Hopeless 0 0 0  PHQ - 2 Score 0 0 0  Altered sleeping 1 0 0  Tired, decreased energy 0 0 0  Change in appetite 2 0 0  Feeling bad or failure about yourself  0 0 0  Trouble concentrating 3 0 0  Moving slowly or fidgety/restless 3 0 0  Suicidal thoughts 0 0  0  PHQ-9 Score 9 0 0  Difficult doing work/chores Very difficult Not difficult at all Not difficult at all  Some encounter information is confidential and restricted. Go to Review Flowsheets activity to see all data.       Assessment & Plan:  HMPX-Discussed continued isolation, I suspect your partner is also infected with MPX, should be evaluated Introduction to clinical pharmacist to set up Apretude and PA with insurance-next appt scheduled for 02/17/21 Discussed MPX transmission, management, lesion evolution, treatment, isolation.  Due to sensitive anatomical location of lesions and pain he is a candidate for TPOXX.  I personally read, reviewed, assessed comprehension regarding CDC EA IND Informed Consent for TPOXX with patient.  All questions were answered.  He signed informed consent and was provided a copy.  Original IC, Intake form and RX was sent to [facility], TPOXX was delivered by clinical pharmacist. TPOXX 200 mg 3 cap bid x 14 days 30 minutes after high caloric fatty meal Note for work until 02/17/2021 Discussed disinfecting surfaces and sterilizing linens Continue Truvada until next appointment if able to initiate Apretude. Avoid all sexual activity, encouraged condoms for 12 weeks post resolution of symptoms. Mupirocin ointment application tid for lesions that appear to have  resolving secondary infection Follow up earlier if symptoms change or worsen.   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 I am also having him maintain his atorvastatin, fluticasone, pantoprazole, sildenafil, famotidine, traZODone, busPIRone, and cyclobenzaprine.   Meds ordered this encounter  Medications   tecovirimat (TPOXX) capsule    Sig: Take 3 capsules (600 mg total) by mouth 2 (two) times daily with a meal for 14 days.    Dispense:  84 capsule    Refill:  0    Order Specific Question:   Supervising Provider    Answer:   VAN DAM, CORNELIUS N [[id number]]   mupirocin cream (BACTROBAN) 2 %    Sig: Apply 1 application topically 2 (two) times daily.    Dispense:  15 g    Refill:  0    Order Specific Question:   Supervising Provider    Answer:   VAN DAM, CORNELIUS N [[id number]]      Follow-up: Return in about 1 week (around 02/13/2021) for follow up MPX.

## 2021-02-06 NOTE — Patient Instructions (Addendum)
Pleasure meeting you Billy Mann.  We are happy to assist you with your PrEP services and Apretude is a great choice. Please also have your partner seek MPX testing. START Tecovirimat - 3 capsules taken TOGETHER twice a day with high fat meal before your medication, as mentioned below    You need 25 grams of fat 30 minutes before each dose of your medication:   This could look like:  -1 avocado -1/4 cup whole cashews  -1/2 cup almonds  -3 tablespoons of nut butter (almond, peanut, cashew)  -4 oz of cooked ground beef 80/20 fat content  -2.5 tablespoons of butter (put it on a bagel, bread!) -3.5 oz goat cheese (regular fat)   You may still experience new lesions coming up over the next 72 hours as the medication begins to work.   Please consider filling out the daily medication / symptom tracker for your convenience. Bring this with you to each follow up visit so we can see how your progress is doing.   You will need to stay isolated at home with well fitting mask over your nose and mouth for 3 weeks after your first rash came up, or until they are all crusted and fall off, whichever is first.   Disinfect common surfaces, bed & bath linens and clothing.   Would like to see you back in 1 week to discuss further - can be done over video visit if you prefer and are overall doing well.   See below for more information.    HUMAN MONKEYPOX ISOLATION RECOMMENDATIONS [email or web address].[email or web address].html  A person with monkeypox can spread it to others from the time symptoms start until the rash has fully healed and a fresh layer of skin has formed. The illness typically lasts 2-4 weeks  Monkeypox can spread to anyone through close, personal, often skin-to-skin contact, including: Direct contact with monkeypox rash, scabs, or body fluids from a person with monkeypox. Touching objects, fabrics (clothing, bedding, or towels), and surfaces that have been used by  someone with monkeypox. Contact with respiratory secretions. This direct contact can happen during intimate contact, including: Oral, anal, and vaginal sex or touching the genitals (penis, testicles, labia, and vagina) or anus (butthole) of a person with monkeypox. Hugging, massage, and kissing. Prolonged face-to-face contact. Touching fabrics and objects during sex that were used by a person with monkeypox and that have not been disinfected, such as bedding, towels, fetish gear, and sex toys. A pregnant person can spread the virus to their fetus through the placenta. It's also possible for people to get monkeypox from infected animals, either by being scratched or bitten by the animal or by preparing or eating meat or using products from an infected animal. Isolation typically lasts two to four weeks.   If you are unable to isolate from others:  While symptomatic with a fever or any respiratory symptoms, including sore throat, nasal congestion, or cough, remain isolated in the home and away from others unless it is necessary to see a healthcare provider or for an emergency. This includes avoiding close or physical contact with other people and animals. Cover the lesions, wear a well-fitting mask (more information below), and avoid public transportation when leaving the home as required for medical care or an emergency. While a rash persists but in the absence of a fever or respiratory symptoms Cover all parts of the rash with clothing, gloves, and/or bandages. Wear a well-fitting mask to prevent the wearer from spreading oral and respiratory secretions when interacting  with others until the rash and all other symptoms have resolved. Masks should fit closely on the face without any gaps along the edges or around the nose and be comfortable when worn properly over the nose and mouth.  Until all signs and symptoms of monkeypox illness have fully resolved Do not share items that have been worn or  handled with other people or animals. Launder or disinfect items that have been worn or handled and surfaces that have been touched by a lesion. Avoid close physical contact, including sexual and/or close intimate contact, with other people. Avoid sharing utensils or cups. Items should be cleaned and disinfected before use by others. Avoid crowds and congregate settings. Wash hands often with soap and water or use an alcohol-based hand sanitizer, especially after direct contact with the rash.    Prevention in your Pets:  People with monkeypox should avoid contact with animals (specifically mammals), including pets. If possible, friends or family members should care for healthy animals until the owner has fully recovered. Keep any potentially infectious bandages, textiles (such as clothes, bedding) and other items away from pets, other domestic animals, and wildlife. In general, any mammal may become infected with monkeypox. It is not thought that other animals such as reptiles, fish or birds can be infected. If you notice an animal that had contact with an infected person appears sick (such as lethargy, lack of appetite, coughing, bloating, nasal or eye secretions or crust, fever, rash) contact the Loss adjuster, chartered, Armed forces training and education officer, or state animal health official.

## 2021-02-06 NOTE — Progress Notes (Signed)
Counseled patient on tecovirimat administration. Patient will take 3 capsules (600 mg) by mouth twice daily for 14 days. Gave 84 tablets to complete treatment. Counseled to take with a full, fatty meal of at least 600 calories and 25 gm of fat. Counseled that the most common side effect is headaches. He will reach out if he has any questions or concerns.   Patient is also on Truvada for PrEP but interested in starting Apretude. Will look into insurance coverage and begin process for approval. Will have him see me tentatively for his first injection on 10/31.

## 2021-02-12 ENCOUNTER — Other Ambulatory Visit: Payer: Self-pay | Admitting: Nurse Practitioner

## 2021-02-12 DIAGNOSIS — K219 Gastro-esophageal reflux disease without esophagitis: Secondary | ICD-10-CM

## 2021-02-13 ENCOUNTER — Other Ambulatory Visit (HOSPITAL_COMMUNITY): Payer: Self-pay | Admitting: Psychiatry

## 2021-02-13 ENCOUNTER — Other Ambulatory Visit (HOSPITAL_COMMUNITY): Payer: Self-pay

## 2021-02-13 ENCOUNTER — Telehealth: Payer: Self-pay

## 2021-02-13 ENCOUNTER — Telehealth: Payer: 59 | Admitting: Physician Assistant

## 2021-02-13 DIAGNOSIS — R6889 Other general symptoms and signs: Secondary | ICD-10-CM

## 2021-02-13 DIAGNOSIS — F323 Major depressive disorder, single episode, severe with psychotic features: Secondary | ICD-10-CM

## 2021-02-13 MED ORDER — LIDOCAINE VISCOUS HCL 2 % MT SOLN: OROMUCOSAL | 0 refills | Status: DC

## 2021-02-13 MED ORDER — PSEUDOEPH-BROMPHEN-DM 30-2-10 MG/5ML PO SYRP: 5.0000 mL | ORAL_SOLUTION | Freq: Four times a day (QID) | ORAL | 0 refills | Status: DC | PRN

## 2021-02-13 MED ORDER — BENZONATATE 100 MG PO CAPS
100.0000 mg | ORAL_CAPSULE | Freq: Three times a day (TID) | ORAL | 0 refills | Status: DC | PRN
Start: 2021-02-13 — End: 2021-02-17

## 2021-02-13 NOTE — Telephone Encounter (Signed)
[facility] Patient Advocate Encounter  J-code [phone] will need a PA for buy and bill I faxed over chart notes to [employer] @ [phone].   I also started a PA for pharmacy benefits. [facility] # [facility]   [employer] # [phone]  Clearance Coots, CPhT [facility] Phone: [phone] Fax:  [phone]

## 2021-02-13 NOTE — Addendum Note (Signed)
Addended by: Margaretann Loveless on: 02/13/2021 03:46 PM   Modules accepted: Orders

## 2021-02-13 NOTE — Progress Notes (Signed)
E visit for Flu like symptoms   We are sorry that you are not feeling well.  Here is how we plan to help! Based on what you have shared with me it looks like you may have flu-like symptoms that should be watched but do not seem to indicate anti-viral treatment.  Influenza or "the flu" is   an infection caused by a respiratory virus. The flu virus is highly contagious and persons who did not receive their yearly flu vaccination may "catch" the flu from close contact.  We have anti-viral medications to treat the viruses that cause this infection. They are not a "cure" and only shorten the course of the infection. These prescriptions are most effective when they are given within the first 2 days of "flu" symptoms. Antiviral medication are indicated if you have a high risk of complications from the flu. You should  also consider an antiviral medication if you are in close contact with someone who is at risk. These medications can help patients avoid complications from the flu  but have side effects that you should know. Possible side effects from Tamiflu or oseltamivir include nausea, vomiting, diarrhea, dizziness, headaches, eye redness, sleep problems or other respiratory symptoms. You should not take Tamiflu if you have an allergy to oseltamivir or any to the ingredients in Tamiflu.  Based upon your symptoms and potential risk factors I recommend that you follow the flu symptoms recommendation that I have listed below.  I have prescribed viscous lidocaine for the sore throat and tessalon perles for the cough.   ANYONE WHO HAS FLU SYMPTOMS SHOULD: Stay home. The flu is highly contagious and going out or to work exposes others! Be sure to drink plenty of fluids. Water is fine as well as fruit juices, sodas and electrolyte beverages. You may want to stay away from caffeine or alcohol. If you are nauseated, try taking small sips of liquids. How do you know if you are getting enough fluid? Your urine  should be a pale yellow or almost colorless. Get rest. Taking a steamy shower or using a humidifier may help nasal congestion and ease sore throat pain. Using a saline nasal spray works much the same way. Cough drops, hard candies and sore throat lozenges may ease your cough. Line up a caregiver. Have someone check on you regularly.   GET HELP RIGHT AWAY IF: You cannot keep down liquids or your medications. You become short of breath Your fell like you are going to pass out or loose consciousness. Your symptoms persist after you have completed your treatment plan MAKE SURE YOU  Understand these instructions. Will watch your condition. Will get help right away if you are not doing well or get worse.  Your e-visit answers were reviewed by a board certified advanced clinical practitioner to complete your personal care plan.  Depending on the condition, your plan could have included both over the counter or prescription medications.  If there is a problem please reply  once you have received a response from your provider.  Your safety is important to [place].  If you have drug allergies check your prescription carefully.    You can use MyChart to ask questions about today's visit, request a non-urgent call back, or ask for a work or school excuse for 24 hours related to this e-Visit. If it has been greater than 24 hours you will need to follow up with your provider, or enter a new e-Visit to address those concerns.  You  will get an e-mail in the next two days asking about your experience.  I hope that your e-visit has been valuable and will speed your recovery. Thank you for using e-visits.  I provided 5 minutes of non face-to-face time during this encounter for chart review and documentation.

## 2021-02-17 ENCOUNTER — Ambulatory Visit (INDEPENDENT_AMBULATORY_CARE_PROVIDER_SITE_OTHER): Payer: 59 | Admitting: Physician Assistant

## 2021-02-17 ENCOUNTER — Encounter: Payer: Self-pay | Admitting: Physician Assistant

## 2021-02-17 ENCOUNTER — Ambulatory Visit: Payer: 59 | Admitting: Pharmacist

## 2021-02-17 ENCOUNTER — Other Ambulatory Visit: Payer: Self-pay

## 2021-02-17 VITALS — BP 171/34 | HR 84 | Temp 99.2°F | Resp 16

## 2021-02-17 DIAGNOSIS — I10 Essential (primary) hypertension: Secondary | ICD-10-CM | POA: Insufficient documentation

## 2021-02-17 DIAGNOSIS — E119 Type 2 diabetes mellitus without complications: Secondary | ICD-10-CM | POA: Diagnosis not present

## 2021-02-17 DIAGNOSIS — L03818 Cellulitis of other sites: Secondary | ICD-10-CM

## 2021-02-17 DIAGNOSIS — B04 Monkeypox: Secondary | ICD-10-CM

## 2021-02-17 MED ORDER — HYDROCHLOROTHIAZIDE 12.5 MG PO TABS: 12.5000 mg | ORAL_TABLET | Freq: Every day | ORAL | 1 refills | Status: DC

## 2021-02-17 MED ORDER — DOXYCYCLINE HYCLATE 100 MG PO TABS: 100.0000 mg | ORAL_TABLET | Freq: Two times a day (BID) | ORAL | 0 refills | Status: AC

## 2021-02-17 NOTE — Patient Instructions (Signed)
Start doxycycline 100 mg twice daily x 7 days Continue mupirocin ointment to lesions Continue TPOXX as instructed Follow up 02/24/2021 at 0945 Encourage you to consider quitting smoking and reducing Red Bull consumption PrEP Apretude should be approved at next visit with insurance Schedule appt with PCP ASAP!

## 2021-02-17 NOTE — Progress Notes (Signed)
Subjective:    Patient ID: Billy Mann, male    DOB: 1981-02-09, 40 y.o.   MRN: [id number]  Chief Complaint  Patient presents with   Follow-up    MPX treatment with TPOXX     HPI:  Billy Mann is a 40 y.o. male presents today for follow up regarding HMPX and TPOXX treatment.Initiated treatment 02/06/21 has been taking twice daily 30 minutes after high cloric and fatty meal.  He has not missed any doses and has tolerated well.  Lesions have scabbed over.  One lesion along base of penis remains edematous and red, mildly tender despite using mupirocin ointment. No new lesions since starting TPOXX.  He denies fever, chills, HA, nausea, vomiting or diarrhea.  He has remained isolated at home.  He is interested in Apretude I notified him his PA has not been completed by insurance yet and we can start next week 02/24/2021 at his final visit.  Pt has a history of DM2 and HTN. He has not been taking metformin for 10 months, this was managed by his PCP in [place], [place].  He has a PCP in [place] which managed his HTN-but he has been out of medication for over a year. He recalls taking lisinopril and going into "renal failure" hospitalized for a week approximately 2 years ago. He does not monitor BP at home.  His BS fasting is typically 124.  His family hx is unknown he was adopted.  He smokes 1/2 ppd for 20 years.  He drink 3 Red Bulls per day.  High sodium diet as well. He is working at [employer]. He denies previous hx of cardiac events.  He denies CP, abd pain, HA, blurry vision, SOB, left arm numbness or tingling.  He does not exercise regularly. I witnessed him schedule PCP appointment for 04/10/21 as that was their earlier appointment.    Allergies  Allergen Reactions   Penicillins Anaphylaxis      Outpatient Medications Prior to Visit  Medication Sig Dispense Refill   atorvastatin (LIPITOR) 20 MG tablet Take 1 tablet (20 mg total) by mouth daily. 90 tablet 3    brompheniramine-pseudoephedrine-DM 30-2-10 MG/5ML syrup Take 5 mLs by mouth 4 (four) times daily as needed. 120 mL 0   busPIRone (BUSPAR) 10 MG tablet TAKE 1 TABLET BY MOUTH THREE TIMES DAILY 90 tablet 0   famotidine (PEPCID) 40 MG tablet Take 1 tablet by mouth once daily 30 tablet 3   fluticasone (FLONASE) 50 MCG/ACT nasal spray Place 2 sprays into both nostrils daily. 16 g 6   lidocaine (XYLOCAINE) 2 % solution 59mL swish and gargle for 15-20 seconds then swallow every 4 hours as needed for sore throat 100 mL 0   pantoprazole (PROTONIX) 40 MG tablet Take 1 tablet by mouth once daily 30 tablet 0   sildenafil (VIAGRA) 100 MG tablet Take 0.5-1 tablets (50-100 mg total) by mouth daily as needed for erectile dysfunction. 6 tablet 5   tecovirimat (TPOXX) capsule Take 3 capsules (600 mg total) by mouth 2 (two) times daily with a meal for 14 days. 84 capsule 0   traZODone (DESYREL) 50 MG tablet TAKE 1 TABLET BY MOUTH AT BEDTIME 30 tablet 0   benzonatate (TESSALON) 100 MG capsule Take 1 capsule (100 mg total) by mouth 3 (three) times daily as needed. 30 capsule 0   No facility-administered medications prior to visit.     Past Medical History:  Diagnosis Date   Anxiety    Phreesia 03/30/2020  Anxiety    Depression    Phreesia 03/30/2020   Depression    GERD (gastroesophageal reflux disease)    Phreesia 03/30/2020   High cholesterol    Hyperlipidemia    Phreesia 03/30/2020   Hypertension    Phreesia 03/30/2020   Sleep apnea    Phreesia 03/30/2020     Past Surgical History:  Procedure Laterality Date   FOOT SURGERY     bilateral hammer toe   TONSILLECTOMY         Review of Systems  Constitutional:  Negative for activity change, appetite change, chills, diaphoresis, fatigue and fever.  HENT:  Negative for mouth sores, sinus pressure and sore throat.   Respiratory:  Negative for cough, shortness of breath, wheezing and stridor.   Cardiovascular:  Negative for chest pain,  palpitations and leg swelling.  Gastrointestinal:  Negative for abdominal pain, anal bleeding, blood in stool, constipation, diarrhea, nausea, rectal pain and vomiting.  Genitourinary:  Positive for genital sores. Negative for difficulty urinating, dysuria, enuresis, flank pain and scrotal swelling.  Musculoskeletal:  Negative for arthralgias, back pain, gait problem, joint swelling, myalgias, neck pain and neck stiffness.  Skin:  Positive for rash.  Hematological:  Negative for adenopathy.  Psychiatric/Behavioral:  Negative for agitation, behavioral problems, confusion, decreased concentration, dysphoric mood and hallucinations.        Pleasantly sitting in exam room, does not appear anxious     Objective:    BP (!) 171/34 Comment: prior hx of HTN  Pulse 84   Temp 99.2 F (37.3 C) (Oral)   Resp 16  Nursing note and vital signs reviewed.  Physical Exam Vitals reviewed. Exam conducted with a chaperone present.  Constitutional:      Appearance: Normal appearance. He is obese.  HENT:     Head: Normocephalic and atraumatic.     Nose: Nose normal.  Eyes:     Extraocular Movements: Extraocular movements intact.     Conjunctiva/sclera: Conjunctivae normal.     Pupils: Pupils are equal, round, and reactive to light.  Neck:     Vascular: No carotid bruit.  Cardiovascular:     Rate and Rhythm: Normal rate and regular rhythm.  Pulmonary:     Effort: Pulmonary effort is normal.     Breath sounds: Normal breath sounds.  Genitourinary:    Penis: Normal.      Testes: Normal.  Musculoskeletal:        General: Normal range of motion.     Cervical back: Normal range of motion and neck supple.  Skin:    General: Skin is warm and dry.     Findings: Lesion present.     Comments: Anterior shaft of penis-crusted over lesion measuring 2 cm in diameter with indurated margins and erythema, not fluctuant no discharge, mildly ttp All other mpx lesions along groin and penis appear to be crusted over  without signs of secondary infection. Negative LAD  Neurological:     General: No focal deficit present.     Mental Status: He is alert and oriented to person, place, and time.  Psychiatric:        Mood and Affect: Mood normal.        Behavior: Behavior normal.        Thought Content: Thought content normal.     Depression screen [facility] 2/9 05/30/2020 05/30/2020 05/21/2020  Decreased Interest 0 0 0  Down, Depressed, Hopeless 0 0 0  PHQ - 2 Score 0 0 0  Altered sleeping  1 0 0  Tired, decreased energy 0 0 0  Change in appetite 2 0 0  Feeling bad or failure about yourself  0 0 0  Trouble concentrating 3 0 0  Moving slowly or fidgety/restless 3 0 0  Suicidal thoughts 0 0 0  PHQ-9 Score 9 0 0  Difficult doing work/chores Very difficult Not difficult at all Not difficult at all  Some encounter information is confidential and restricted. Go to Review Flowsheets activity to see all data.       Assessment & Plan:  HMPX-continue TPOXX as instructed, remain isolated, follow up in 1 week for final visit Cellulitis-add doxy 100 mg bid x 7 days w mupirocin due to possible secondary infection with lesion at base of penis PrEP-schedule in 1 week once PA received, discussed with Cassie HTN-start HCTZ 12.5 mg once daily, reviewed CMP from 01/31/2021, renal function intact. PCP appt scheduled earlier available is 12/22, advised to get automatic cuff to check BP at home, asymptomatic at appt and stable at discharge -DASH diet -Exercise 30 minutes daily -Discussed smoking cessation -Disconitnue Redb  Bull consumption  DM2-A1c ordered today and lipids previously taking metformin has not taken in 10 months. BS at home 116-124 fasting advised to take on 2 hour pp to provide to PCP, continue atorvastatin  The 10-year ASCVD risk score (Arnett DK, et al., 2019) is: 36.3%   Values used to calculate the score:     Age: 43 years     Sex: Male     Is Non-Hispanic African American: No     Diabetic: Yes      Tobacco smoker: Yes     Systolic Blood Pressure: 171 mmHg     Is BP treated: Yes     HDL Cholesterol: 37 mg/dL     Total Cholesterol: 283 mg/dL   Patient Active Problem List   Diagnosis Date Noted   Hypertension 02/17/2021   Human monkeypox 02/06/2021   On pre-exposure prophylaxis for HIV 02/06/2021   GERD (gastroesophageal reflux disease) 06/10/2020   Diarrhea 06/10/2020   Mixed hyperlipidemia 04/09/2020   Attention deficit hyperactivity disorder (ADHD), 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   Generalized anxiety disorder 02/19/2020   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   Diabetes mellitus type 2, controlled (HCC) 05/18/2018     Problem List Items Addressed This Visit       Cardiovascular and Mediastinum   Hypertension   Relevant Medications   hydrochlorothiazide (HYDRODIURIL) 12.5 MG tablet   Other Relevant Orders   Lipid panel     Endocrine   Diabetes mellitus type 2, controlled (HCC)   Relevant Orders   Lipid panel   Hemoglobin A1c     Other   Human monkeypox - Primary   Other Visit Diagnoses     Cellulitis of other specified site       Relevant Medications   doxycycline (VIBRA-TABS) 100 MG tablet        I have discontinued Tinnie Gens Wirz "Jeff"'s benzonatate. I am also having him start on doxycycline and hydrochlorothiazide. Additionally, I am having him maintain his atorvastatin, fluticasone, sildenafil, famotidine, busPIRone, Tpoxx, pantoprazole, lidocaine, brompheniramine-pseudoephedrine-DM, and traZODone.   Meds ordered this encounter  Medications   doxycycline (VIBRA-TABS) 100 MG tablet    Sig: Take 1 tablet (100 mg total) by mouth 2 (two) times daily for 7 days.    Dispense:  14 tablet    Refill:  0  Order Specific Question:   Supervising Provider    Answer:   VAN DAM, CORNELIUS N [[id number]]   hydrochlorothiazide (HYDRODIURIL) 12.5 MG tablet    Sig:  Take 1 tablet (12.5 mg total) by mouth daily.    Dispense:  30 tablet    Refill:  1    Order Specific Question:   Supervising Provider    Answer:   VAN DAM, CORNELIUS N [[id number]]     Follow-up: Return in about 1 week (around 02/24/2021) for MPX and PrEP.

## 2021-02-18 LAB — LIPID PANEL
Cholesterol: 206 mg/dL — ABNORMAL HIGH (ref <200)
HDL: 55 mg/dL (ref >=40)
LDL Cholesterol (Calc): 134 mg/dL (calc) — ABNORMAL HIGH
Non-HDL Cholesterol (Calc): 151 mg/dL (calc) — ABNORMAL HIGH (ref <130)
Total CHOL/HDL Ratio: 3.7 (calc) (ref <5.0)
Triglycerides: 78 mg/dL (ref <150)

## 2021-02-18 LAB — HEMOGLOBIN A1C
Hgb A1c MFr Bld: 4.8 % of total Hgb (ref <5.7)
Mean Plasma Glucose: 91 mg/dL
eAG (mmol/L): 5 mmol/L

## 2021-02-19 ENCOUNTER — Other Ambulatory Visit: Payer: Self-pay | Admitting: Physician Assistant

## 2021-02-19 DIAGNOSIS — R6889 Other general symptoms and signs: Secondary | ICD-10-CM

## 2021-02-20 ENCOUNTER — Telehealth: Payer: Self-pay

## 2021-02-20 ENCOUNTER — Encounter: Payer: Self-pay | Admitting: Pharmacist

## 2021-02-20 ENCOUNTER — Other Ambulatory Visit: Payer: Self-pay | Admitting: Physician Assistant

## 2021-02-20 DIAGNOSIS — R6889 Other general symptoms and signs: Secondary | ICD-10-CM

## 2021-02-20 NOTE — Telephone Encounter (Signed)
[facility] Patient Advocate Encounter  Apretude is covered under patient medical benefits [phone]).  Through 02/13/21-02/13/22  Patient will have a $20.00 specialist office visit copay.  Patient is enrolled in [employer] Portal Claim  Clearance Coots, CPhT Specialty Pharmacy Patient [facility] Phone: [phone] Fax:  [phone]

## 2021-02-23 ENCOUNTER — Other Ambulatory Visit: Payer: Self-pay | Admitting: Pharmacist

## 2021-02-23 DIAGNOSIS — Z79899 Other long term (current) drug therapy: Secondary | ICD-10-CM

## 2021-02-24 ENCOUNTER — Ambulatory Visit (INDEPENDENT_AMBULATORY_CARE_PROVIDER_SITE_OTHER): Payer: 59 | Admitting: Physician Assistant

## 2021-02-24 ENCOUNTER — Other Ambulatory Visit: Payer: Self-pay | Admitting: Pharmacist

## 2021-02-24 ENCOUNTER — Other Ambulatory Visit: Payer: Self-pay

## 2021-02-24 ENCOUNTER — Encounter: Payer: Self-pay | Admitting: Physician Assistant

## 2021-02-24 ENCOUNTER — Ambulatory Visit (INDEPENDENT_AMBULATORY_CARE_PROVIDER_SITE_OTHER): Payer: 59 | Admitting: Pharmacist

## 2021-02-24 VITALS — BP 128/82 | HR 98 | Temp 98.7°F | Resp 16 | Wt 170.0 lb

## 2021-02-24 DIAGNOSIS — B04 Monkeypox: Secondary | ICD-10-CM | POA: Diagnosis not present

## 2021-02-24 DIAGNOSIS — K219 Gastro-esophageal reflux disease without esophagitis: Secondary | ICD-10-CM

## 2021-02-24 DIAGNOSIS — Z79899 Other long term (current) drug therapy: Secondary | ICD-10-CM | POA: Diagnosis not present

## 2021-02-24 DIAGNOSIS — R197 Diarrhea, unspecified: Secondary | ICD-10-CM

## 2021-02-24 DIAGNOSIS — Z8619 Personal history of other infectious and parasitic diseases: Secondary | ICD-10-CM

## 2021-02-24 DIAGNOSIS — I10 Essential (primary) hypertension: Secondary | ICD-10-CM

## 2021-02-24 MED ORDER — CABOTEGRAVIR ER 600 MG/3ML IM SUER
600.0000 mg | Freq: Once | INTRAMUSCULAR | Status: AC
  Administered 2021-02-24: 600 mg via INTRAMUSCULAR

## 2021-02-24 NOTE — Progress Notes (Signed)
HPI: Billy Mann is a 40 y.o. male who presents to the [facility] pharmacy clinic for Apretude administration and HIV PrEP follow up.  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   BUSPIRONE (BUSPAR) 10 MG TABLET    TAKE 1 TABLET BY MOUTH THREE TIMES DAILY   DOXYCYCLINE (VIBRA-TABS) 100 MG TABLET    Take 1 tablet (100 mg total) by mouth 2 (two) times daily for 7 days.   FAMOTIDINE (PEPCID) 40 MG TABLET    Take 1 tablet by mouth once daily   FLUTICASONE (FLONASE) 50 MCG/ACT NASAL SPRAY    Place 2 sprays into both nostrils daily.   HYDROCHLOROTHIAZIDE (HYDRODIURIL) 12.5 MG TABLET    Take 1 tablet (12.5 mg total) by mouth daily.   LIDOCAINE (XYLOCAINE) 2 % SOLUTION    5mL swish and gargle for 15-20 seconds then swallow every 4 hours as needed for sore throat   PANTOPRAZOLE (PROTONIX) 40 MG TABLET    Take 1 tablet by mouth once daily   SILDENAFIL (VIAGRA) 100 MG TABLET    Take 0.5-1 tablets (50-100 mg total) by mouth daily as needed for erectile dysfunction.   TRAZODONE (DESYREL) 50 MG TABLET    TAKE 1 TABLET BY MOUTH AT BEDTIME  Modified Medications   No medications on file  Discontinued Medications   No medications on file     Allergies: Allergies  Allergen Reactions   Penicillins Anaphylaxis    Past Medical History: Past Medical History:  Diagnosis Date   Anxiety    Phreesia 03/30/2020   Anxiety    Depression    Phreesia 03/30/2020   Depression    GERD (gastroesophageal reflux disease)    Phreesia 03/30/2020   High cholesterol    Hyperlipidemia    Phreesia 03/30/2020   Hypertension    Phreesia 03/30/2020   Sleep apnea    Phreesia 03/30/2020    Social History: Social History   Socioeconomic History   Marital status: Single    Spouse name: Not on file   Number of children: Not on file   Years of education: Not on file   Highest education level: Not on file  Occupational History   Occupation: bartender  Tobacco Use   Smoking status: Every Day    Types: Cigarettes   Smokeless tobacco: Never  Vaping Use   Vaping Use: Never used  Substance and Sexual Activity   Alcohol use: Never   Drug use: Yes    Types: Methamphetamines   Sexual activity: Yes  Other Topics Concern   Not on file  Social History Narrative   Not on file   Social Determinants of Health   Financial Resource Strain: Not on file  Food Insecurity: Not on file  Transportation Needs: Not on file  Physical Activity: Not  on file  Stress: Not on file  Social Connections: Not on file    Labs: No results found for: HIV1RNAQUANT, HIV1RNAVL, CD4TABS  RPR and STI Lab Results  Component Value Date   LABRPR Non Reactive 01/31/2021    No flowsheet data found.  Hepatitis B No results found for: HEPBSAB, HEPBSAG, HEPBCAB Hepatitis C No results found for: HEPCAB, HCVRNAPCRQN Hepatitis A No results found for: HAV Lipids: Lab Results  Component Value Date   CHOL 206 (H) 02/17/2021   TRIG 78 02/17/2021   HDL 55 02/17/2021   CHOLHDL 3.7 02/17/2021   LDLCALC 134 (H) 02/17/2021    TARGET DATE: The 7th of the month  Current PrEP Regimen: Apretude  Assessment: Billy Mann presents today for their first initiation  injection of Apretude and to follow up for HIV PrEP.  Counseled that Apretude is one intramuscular injection in the gluteal muscle for each visit. Explained that the second injection is 30 days after the initial injection then every 2 months thereafter. Discussed follow up appointments moving forward. It is required to have a negative HIV test immediately prior to injection administration. A rapid HIV blood test will be drawn each visit, and patient must wait for results before getting injection, which typically takes 20-30 minutes. Will make lab appointments for each injection 30 minutes prior to injection appointment. Screened patient for acute HIV symptoms such as fatigue, muscle aches, rash, sore throat, lymphadenopathy, headache, night sweats, nausea/vomiting/diarrhea, and fever. Patient denies any symptoms.   Explained that showing up to injection appointments is very important and warned that if appointments are missed, protection will be minimal and the risk of acquiring HIV becomes much higher. Counseled on possible side effects associated with the injections such as injection site pain, which is usually mild to moderate in nature, injection site nodules, and injection site reactions. Asked to call the clinic or send me a mychart message if they experience any issues. Advised that they can take Motrin or Tylenol for injection site pain if needed. They may also pre-treat with Motrin or Tylenol about 30-45 minutes before scheduled appointments.   Administered cabotegravir 600mg /40mL in right upper outer quadrant of the gluteal muscle. Monitored patient for 10 minutes after injection. Injections were tolerated well without issue. Counseled to call with any issues that may arise. Will make follow up appointments for second initiation injection in 30 days and then maintenance injections every 2 months thereafter.   Plan: - Take last dose of Truvada today - First Apretude injection administered - Second  initiation injection scheduled for 03/25/21 @ 4:00 - Maintenance injections scheduled for 05/27/21 @ 4:00 - Call with any issues or questions  07/25/21, PharmD PGY2 Infectious Diseases Pharmacy Resident

## 2021-02-24 NOTE — Progress Notes (Signed)
Subjective:    Patient ID: Billy Mann, male    DOB: 1980/05/09, 40 y.o.   MRN: [id number]  Chief Complaint  Patient presents with   Follow-up    Final mpx visit completed tpoxx Start Apretude PrEP      HPI:  Billy Mann is a 40 y.o. male presents today for follow up on MPX and PrEP.  He has completed TPOXX and doxycycline (secondary infection on mpx lesion at base of penis) he reports all lesions have resolved and epithelialized apart from the lesion that was infected.  He states scab has fallen off but large open wound remains.  Not tender or red.  Purulen discharge drained a day ago. He denies any SAEs or adverse events associated with TPOSS, no missed doses.  He is ready to return to [employer] to work.  His partner did not have MPX.   PrEP he has been taking Truvada daily without any missed doses and tolerated well.  His insurance approved Aprteude and will receive today.   HTN-He has been monitoring BP at home range is 108/64 this morning and consistently less than 120/80.  He continues to smoke 5 cigarettes a day, mostly eating vegetables.  He was previously obese at 240 lbs and continues to have heart burn while taking protonix and famitodine. Triggers are greasy fried foods. He is scheduled for PCP appt 04/10/21.  He denies CP, SOB or any cardiac symptoms.  Diarrhea-onset 3 days ago, has had diarrhea in sleep.  He initially had mild fever, but otherwise feels well. Loose, watery brown stool every hour. He has maintained hydration. He stated it resolved a day ago then started back up this morning when he ate fish and chips. He will continue to monitor.  He denies BRB, black tarry stool, abd pain, fever, chills, fatigue, dizziness, urinary symptoms, CP, SOB, HA. + hx of Cdiff, recent use of doxycycline.      Allergies  Allergen Reactions   Penicillins Anaphylaxis      Outpatient Medications Prior to Visit  Medication Sig Dispense Refill   atorvastatin (LIPITOR) 20  MG tablet Take 1 tablet (20 mg total) by mouth daily. 90 tablet 3   brompheniramine-pseudoephedrine-DM 30-2-10 MG/5ML syrup Take 5 mLs by mouth 4 (four) times daily as needed. 120 mL 0   busPIRone (BUSPAR) 10 MG tablet TAKE 1 TABLET BY MOUTH THREE TIMES DAILY 90 tablet 0   doxycycline (VIBRA-TABS) 100 MG tablet Take 1 tablet (100 mg total) by mouth 2 (two) times daily for 7 days. 14 tablet 0   famotidine (PEPCID) 40 MG tablet Take 1 tablet by mouth once daily 30 tablet 3   fluticasone (FLONASE) 50 MCG/ACT nasal spray Place 2 sprays into both nostrils daily. 16 g 6   hydrochlorothiazide (HYDRODIURIL) 12.5 MG tablet Take 1 tablet (12.5 mg total) by mouth daily. 30 tablet 1   lidocaine (XYLOCAINE) 2 % solution 62mL swish and gargle for 15-20 seconds then swallow every 4 hours as needed for sore throat 100 mL 0   pantoprazole (PROTONIX) 40 MG tablet Take 1 tablet by mouth once daily 30 tablet 0   sildenafil (VIAGRA) 100 MG tablet Take 0.5-1 tablets (50-100 mg total) by mouth daily as needed for erectile dysfunction. 6 tablet 5   traZODone (DESYREL) 50 MG tablet TAKE 1 TABLET BY MOUTH AT BEDTIME 30 tablet 0   No facility-administered medications prior to visit.     Past Medical History:  Diagnosis Date   Anxiety  Phreesia 03/30/2020   Anxiety    Depression    Phreesia 03/30/2020   Depression    GERD (gastroesophageal reflux disease)    Phreesia 03/30/2020   High cholesterol    Hyperlipidemia    Phreesia 03/30/2020   Hypertension    Phreesia 03/30/2020   Sleep apnea    Phreesia 03/30/2020     Past Surgical History:  Procedure Laterality Date   FOOT SURGERY     bilateral hammer toe   TONSILLECTOMY         Review of Systems  Constitutional:  Positive for appetite change. Negative for chills, diaphoresis, fatigue and fever.  HENT:  Negative for mouth sores and sore throat.   Respiratory:  Negative for chest tightness, shortness of breath and stridor.   Cardiovascular:   Negative for chest pain, palpitations and leg swelling.  Gastrointestinal:  Positive for diarrhea. Negative for anal bleeding, blood in stool, constipation, nausea, rectal pain and vomiting.  Genitourinary:  Negative for penile pain, scrotal swelling, testicular pain and urgency.  Musculoskeletal: Negative.   Allergic/Immunologic: Negative for food allergies and immunocompromised state.  Neurological:  Negative for dizziness, syncope, weakness, light-headedness and headaches.  Hematological:  Negative for adenopathy.  Psychiatric/Behavioral: Negative.       Objective:    BP 128/82   Pulse 98   Temp 98.7 F (37.1 C)   Resp 16   Wt 170 lb (77.1 kg)   SpO2 98%   BMI 24.39 kg/m  Nursing note and vital signs reviewed.  Physical Exam Vitals reviewed.  Constitutional:      General: He is not in acute distress.    Appearance: Normal appearance. He is normal weight. He is not ill-appearing.  HENT:     Head: Normocephalic and atraumatic.  Eyes:     Extraocular Movements: Extraocular movements intact.     Conjunctiva/sclera: Conjunctivae normal.     Pupils: Pupils are equal, round, and reactive to light.  Cardiovascular:     Rate and Rhythm: Normal rate and regular rhythm.     Pulses: Normal pulses.  Pulmonary:     Effort: Pulmonary effort is normal.     Breath sounds: Normal breath sounds.  Abdominal:     General: Bowel sounds are normal.     Tenderness: There is no guarding or rebound.  Genitourinary:    Penis: Normal.      Testes: Normal.  Musculoskeletal:        General: Normal range of motion.     Cervical back: Normal range of motion and neck supple.  Skin:    General: Skin is warm.     Findings: Lesion (healing mpx lesion at base of penis-improved no erythema, nttp, no edema, some purulent discharge present, all other MPX sites epithelialized) present.  Neurological:     Mental Status: He is alert and oriented to person, place, and time.  Psychiatric:        Mood and  Affect: Mood normal.        Behavior: Behavior normal.        Thought Content: Thought content normal.        Judgment: Judgment normal.     Depression screen [facility] 2/9 05/30/2020 05/30/2020 05/21/2020  Decreased Interest 0 0 0  Down, Depressed, Hopeless 0 0 0  PHQ - 2 Score 0 0 0  Altered sleeping 1 0 0  Tired, decreased energy 0 0 0  Change in appetite 2 0 0  Feeling bad or failure about yourself  0 0 0  Trouble concentrating 3 0 0  Moving slowly or fidgety/restless 3 0 0  Suicidal thoughts 0 0 0  PHQ-9 Score 9 0 0  Difficult doing work/chores Very difficult Not difficult at all Not difficult at all  Some encounter information is confidential and restricted. Go to Review Flowsheets activity to see all data.       Assessment & Plan:  MPX-completed TPOXX, MPX resolved, lingering lesion due to secondary infection, completed doxycycline and appears to be healing nicely, may return to work 02/25/21 PrEP-rapid HIV negative, start Apretude, sent VL-f/u as instructed by pharmacy  Diarrhea-BRAT diet, may have been due to doxy, hydrate with electrolyte rich fluids, follow up if symptoms worsen or change HTN-continue HCTZ 12.5, continue monitoring BP at home, DASH diet, smoking cessation counseling 5 minutes, exercise 30 minutes daily, follow up PCP 04/10/21  Patient Active Problem List   Diagnosis Date Noted   History of Clostridioides difficile colitis 02/24/2021   Hypertension 02/17/2021   Human monkeypox 02/06/2021   On pre-exposure prophylaxis for HIV 02/06/2021   GERD (gastroesophageal reflux disease) 06/10/2020   Diarrhea 06/10/2020   Mixed hyperlipidemia 04/09/2020   Attention deficit hyperactivity disorder (ADHD), 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   Generalized anxiety disorder 02/19/2020   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[place]) 02/19/2020   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   Diabetes mellitus type 2, controlled ([place]) 05/18/2018      Problem List Items Addressed This Visit       Digestive   GERD (gastroesophageal reflux disease)     Other   Diarrhea   Human monkeypox   On pre-exposure prophylaxis for HIV - Primary   Relevant Orders   Rapid HIV screen (HIV 1/2 Ab+Ag)   HIV-1 RNA ultraquant reflex to gentyp+   History of Clostridioides difficile colitis     I am having [name] "Merry Proud" maintain his atorvastatin, fluticasone, sildenafil, famotidine, busPIRone, pantoprazole, lidocaine, brompheniramine-pseudoephedrine-DM, traZODone, doxycycline, and hydrochlorothiazide.   No orders of the defined types were placed in this encounter.    Follow-up: Return in about 8 weeks (around 04/21/2021) for PreP Apretude.

## 2021-02-24 NOTE — Patient Instructions (Signed)
Start Apretude today May return to work I expect lesion to be healed along groin in 5-7 days, keep clean and dry. \ Let me know if diarrhea does not resolve Blood pressure is in excellent control, follow up with primary as directed in December HgbA1c perfect at 4.8! Avoid greasy, fatty foods, and cigarettes this can trigger heartburn.

## 2021-02-25 ENCOUNTER — Ambulatory Visit: Payer: 59 | Admitting: Pharmacist

## 2021-02-26 LAB — HIV-1 RNA QUANT-NO REFLEX-BLD
HIV 1 RNA Quant: NOT DETECTED Copies/mL
HIV-1 RNA Quant, Log: NOT DETECTED Log cps/mL

## 2021-02-28 ENCOUNTER — Other Ambulatory Visit: Payer: Self-pay | Admitting: Nurse Practitioner

## 2021-02-28 ENCOUNTER — Other Ambulatory Visit (HOSPITAL_COMMUNITY): Payer: Self-pay | Admitting: Psychiatry

## 2021-02-28 DIAGNOSIS — F323 Major depressive disorder, single episode, severe with psychotic features: Secondary | ICD-10-CM

## 2021-02-28 DIAGNOSIS — K219 Gastro-esophageal reflux disease without esophagitis: Secondary | ICD-10-CM

## 2021-03-16 ENCOUNTER — Other Ambulatory Visit: Payer: Self-pay | Admitting: Nurse Practitioner

## 2021-03-16 DIAGNOSIS — K219 Gastro-esophageal reflux disease without esophagitis: Secondary | ICD-10-CM

## 2021-03-24 ENCOUNTER — Other Ambulatory Visit: Payer: Self-pay | Admitting: Nurse Practitioner

## 2021-03-24 DIAGNOSIS — K219 Gastro-esophageal reflux disease without esophagitis: Secondary | ICD-10-CM

## 2021-03-25 ENCOUNTER — Other Ambulatory Visit: Payer: 59

## 2021-03-25 ENCOUNTER — Ambulatory Visit: Payer: 59 | Admitting: Pharmacist

## 2021-03-25 ENCOUNTER — Telehealth: Payer: 59 | Admitting: Family

## 2021-03-25 ENCOUNTER — Telehealth: Payer: 59 | Admitting: Physician Assistant

## 2021-03-25 DIAGNOSIS — K047 Periapical abscess without sinus: Secondary | ICD-10-CM | POA: Diagnosis not present

## 2021-03-25 MED ORDER — CLINDAMYCIN HCL 300 MG PO CAPS: 300.0000 mg | ORAL_CAPSULE | Freq: Three times a day (TID) | ORAL | 0 refills | Status: AC

## 2021-03-25 NOTE — Progress Notes (Signed)
Virtual Visit Consent   Jobie Ramani, you are scheduled for a virtual visit with a [facility] provider today.     Just as with appointments in the office, your consent must be obtained to participate.  Your consent will be active for this visit and any virtual visit you may have with one of our providers in the next 365 days.     If you have a MyChart account, a copy of this consent can be sent to you electronically.  All virtual visits are billed to your insurance company just like a traditional visit in the office.    As this is a virtual visit, video technology does not allow for your provider to perform a traditional examination.  This may limit your provider's ability to fully assess your condition.  If your provider identifies any concerns that need to be evaluated in person or the need to arrange testing (such as labs, EKG, etc.), we will make arrangements to do so.     Although advances in technology are sophisticated, we cannot ensure that it will always work on either your end or our end.  If the connection with a video visit is poor, the visit may have to be switched to a telephone visit.  With either a video or telephone visit, we are not always able to ensure that we have a secure connection.     I need to obtain your verbal consent now.   Are you willing to proceed with your visit today?    Khole Canizares has provided verbal consent on 03/25/2021 for a virtual visit (video or telephone).   Evelina Dun, FNP   Date: 03/25/2021 7:00 PM   Virtual Visit via Video Note   I, Evelina Dun, connected with  Mychael Holroyd  ([id number], October 04, 1980) on 03/25/21 at  7:00 PM EST by a video-enabled telemedicine application and verified that I am speaking with the correct person using two identifiers.  Location: Patient: Virtual Visit Location Patient: Home Provider: Virtual Visit Location Provider: Home Office   I discussed the limitations of evaluation and management by  telemedicine and the availability of in person appointments. The patient expressed understanding and agreed to proceed.    History of Present Illness: Billy Mann is a 40 y.o. who identifies as a male who was assigned male at birth, and is being seen today for tooth pain on his right lower gum that started a little over a week ago. IT has worsen. He reports there is a lot of swelling. Denies any fever and is able to eat. He is eating a softer diet.   He is new to the area and does not have a dentist. He does have a name and has not called.  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   Diabetes mellitus type 2, controlled ([place]) 05/18/2018    Allergies:  Allergies  Allergen Reactions   Penicillins Anaphylaxis   Medications:  Current Outpatient Medications:    clindamycin (CLEOCIN) 300 MG capsule, Take 1 capsule (300  mg total) by mouth 3 (three) times daily for 10 days., Disp: 30 capsule, Rfl: 0   atorvastatin (LIPITOR) 20 MG tablet, Take 1 tablet (20 mg total) by mouth daily., Disp: 90 tablet, Rfl: 3   busPIRone (BUSPAR) 10 MG tablet, TAKE 1 TABLET BY MOUTH THREE TIMES DAILY, Disp: 90 tablet, Rfl: 0   famotidine (PEPCID) 40 MG tablet, Take 1 tablet by mouth once daily, Disp: 30 tablet, Rfl: 3   fluticasone (FLONASE) 50 MCG/ACT nasal spray, Place 2 sprays into both nostrils daily., Disp: 16 g, Rfl: 6   hydrochlorothiazide  (HYDRODIURIL) 12.5 MG tablet, Take 1 tablet (12.5 mg total) by mouth daily., Disp: 30 tablet, Rfl: 1   pantoprazole (PROTONIX) 40 MG tablet, Take 1 tablet by mouth once daily, Disp: 90 tablet, Rfl: 3   sildenafil (VIAGRA) 100 MG tablet, Take 0.5-1 tablets (50-100 mg total) by mouth daily as needed for erectile dysfunction., Disp: 6 tablet, Rfl: 5   traZODone (DESYREL) 50 MG tablet, TAKE 1 TABLET BY MOUTH AT BEDTIME, Disp: 30 tablet, Rfl: 0  Observations/Objective: Patient is well-developed, well-nourished in no acute distress.  Resting comfortably  at home.  Head is normocephalic, atraumatic.  No labored breathing.  Speech is clear and coherent with logical content.  Patient is alert and oriented at baseline.  Mild right lower gum swelling. Able to talk and open mouth on exam.   Assessment and Plan: 1. Dental abscess - clindamycin (CLEOCIN) 300 MG capsule; Take 1 capsule (300 mg total) by mouth 3 (three) times daily for 10 days.  Dispense: 30 capsule; Refill: 0 Keep clean and dry  Swish after meals Call and make follow up with dentist  Follow up if symptoms worsen or do not improve   Follow Up Instructions: I discussed the assessment and treatment plan with the patient. The patient was provided an opportunity to ask questions and all were answered. The patient agreed with the plan and demonstrated an understanding of the instructions.  A copy of instructions were sent to the patient via MyChart unless otherwise noted below.     The patient was advised to call back or seek an in-person evaluation if the symptoms worsen or if the condition fails to improve as anticipated.  Time:  I spent 6 minutes with the patient via telehealth technology discussing the above problems/concerns.    Jannifer Rodney, FNP

## 2021-03-25 NOTE — Patient Instructions (Signed)
Dental Abscess ?A dental abscess is an infection around a tooth that may involve pain, swelling, and a collection of pus, as well as other symptoms. Treatment is important to help with symptoms and to prevent the infection from spreading. ?The general types of dental abscesses are: ?Pulpal abscess. This abscess may form from the inner part of the tooth (pulp). ?Periodontal abscess. This abscess may form from the gum. ?What are the causes? ?This condition is caused by a bacterial infection in or around the tooth. It may result from: ?Severe tooth decay (cavities). ?Trauma to the tooth, such as a broken or chipped tooth. ?What increases the risk? ?This condition is more likely to develop in males. It is also more likely to develop in people who: ?Have cavities. ?Have severe gum disease. ?Eat sugary snacks between meals. ?Use tobacco products. ?Have diabetes. ?Have a weakened disease-fighting system (immune system). ?Do not brush and care for their teeth regularly. ?What are the signs or symptoms? ?Mild symptoms of this condition include: ?Tenderness. ?Bad breath. ?Fever. ?A bitter taste in the mouth. ?Pain in and around the infected tooth. ?Moderate symptoms of this condition include: ?Swollen neck glands. ?Chills. ?Pus drainage. ?Swelling and redness around the infected tooth, in the mouth, or in the face. ?Severe pain in and around the infected tooth. ?Severe symptoms of this condition include: ?Difficulty swallowing. ?Difficulty opening the mouth. ?Nausea. ?Vomiting. ?How is this diagnosed? ?This condition is diagnosed based on: ?Your symptoms and your medical and dental history. ?An examination of the infected tooth. During the exam, your dental care provider may tap on the infected tooth. ?You may also need to have X-rays taken of the affected area. ?How is this treated? ?This condition is treated by getting rid of the infection. This may be done with: ?Antibiotic medicines. These may be used in certain  situations. ?Antibacterial mouth rinse. ?Incision and drainage. This procedure is done by making an incision in the abscess to drain out the pus. Removing pus is the first priority in treating an abscess. ?A root canal. This may be performed to save the tooth. Your dental care provider accesses the visible part of your tooth (crown) with a drill and removes any infected pulp. Then the space is filled and sealed off. ?Tooth extraction. The tooth is pulled out if it cannot be saved by other treatment. ?You may also receive treatment for pain, such as: ?Acetaminophen or NSAIDs. ?Gels that contain a numbing medicine. ?An injection to block the pain near your nerve. ?Follow these instructions at home: ?Medicines ?Take over-the-counter and prescription medicines only as told by your dental care provider. ?If you were prescribed an antibiotic, take it as told by your dental care provider. Do not stop taking the antibiotic even if you start to feel better. ?If you were prescribed a gel that contains a numbing medicine, use it exactly as told in the directions. Do not use these gels for children who are younger than 2 years of age. ?Use an antibacterial mouth rinse as told by your dental care provider. ?General instructions ? ?Gargle with a mixture of salt and water 3-4 times a day or as needed. To make salt water, completely dissolve ?-1 tsp (3-6 g) of salt in 1 cup (237 mL) of warm water. ?Eat a soft diet while your abscess is healing. ?Drink enough fluid to keep your urine pale yellow. ?Do not apply heat to the outside of your mouth. ?Do not use any products that contain nicotine or tobacco. These products   include cigarettes, chewing tobacco, and vaping devices, such as e-cigarettes. If you need help quitting, ask your dental care provider. ?Keep all follow-up visits. This is important. ?How is this prevented? ? ?Excellent dental home care, which includes brushing your teeth every morning and night with fluoride  toothpaste. Floss one time each day. ?Get regularly scheduled dental cleanings. ?Consider having a dental sealant applied on teeth that have deep grooves to prevent cavities. ?Drink fluoridated water regularly. This includes most tap water. Check the label on bottled water to see if it contains fluoride. ?Reduce or eliminate sugary drinks. ?Eat healthy meals and snacks. ?Wear a mouth guard or face shield to protect your teeth while playing sports. ?Contact a health care provider if: ?Your pain is worse and is not helped by medicine. ?You have swelling. ?You see pus around the tooth. ?You have a fever or chills. ?Get help right away if: ?Your symptoms suddenly get worse. ?You have a very bad headache. ?You have problems breathing or swallowing. ?You have trouble opening your mouth. ?You have swelling in your neck or around your eye. ?These symptoms may represent a serious problem that is an emergency. Do not wait to see if the symptoms will go away. Get medical help right away. Call your local emergency services (911 in the [place] Do not drive yourself to the hospital. ?Summary ?A dental abscess is a collection of pus in or around a tooth that results from an infection. ?A dental abscess may result from severe tooth decay, trauma to the tooth, or severe gum disease around a tooth. ?Symptoms include severe pain, swelling, redness, and drainage of pus in and around the infected tooth. ?The first priority in treating a dental abscess is to drain out the pus. Treatment may also involve removing damage inside the tooth (root canal) or extracting the tooth. ?This information is not intended to replace advice given to you by your health care provider. Make sure you discuss any questions you have with your health care provider. ?Document Revised: 06/13/2020 Document Reviewed: 06/13/2020 ?Elsevier Patient Education ? 2022 Elsevier Inc. ? ?

## 2021-03-25 NOTE — Progress Notes (Signed)
Based on what you shared with me, I feel your condition warrants further evaluation and I recommend that you be seen in a face to face visit.  Giving the severity of pain and inability to fully open the jaw/mouth -- you need to be evaluated in person ASAP at local Urgent Care on at ER for evaluation as you may need drainage of abscess and intramusclar or IV antibiotics.    NOTE: There will be NO CHARGE for this eVisit   If you are having a true medical emergency please call 911.      For an urgent face to face visit, [facility] has six urgent care centers for your convenience:     [facility] Health Urgent [facility] Center at [facility] Directions [phone] [address] [place], [place] [address]    [facility] Health Urgent Care Center [facility]) Get Driving Directions [phone] [address] [place], [place] [address]  [facility] Health Urgent Care Center [facility]) Get Driving Directions [phone] [address] [place],  [place]  [address]  [facility] Health Urgent Care at [facility] Get Driving Directions [phone] [address] [place], [place] [address]   [facility] Health Urgent Care at [facility] Get Driving Directions  [phone] [address] [place], [place] [address]   [facility] Health Urgent Care at [facility] Directions [phone] [address], [address] [place], [place] [address]  Your MyChart E-visit questionnaire answers were reviewed by a board certified advanced clinical practitioner to complete your personal care plan based on your specific symptoms.  Thank you for using e-Visits.

## 2021-03-27 ENCOUNTER — Ambulatory Visit: Payer: 59 | Admitting: Family Medicine

## 2021-04-01 ENCOUNTER — Ambulatory Visit: Payer: 59 | Admitting: Pharmacist

## 2021-04-01 ENCOUNTER — Other Ambulatory Visit: Payer: 59

## 2021-04-10 ENCOUNTER — Ambulatory Visit: Payer: 59 | Admitting: Family Medicine

## 2021-04-15 NOTE — Progress Notes (Deleted)
Patient ID: Billy Mann, male    DOB: 29-Jun-1980  MRN: [id number]  CC: No chief complaint on file.  TO BE AMELIA PATIENT   Subjective: Billy Mann is a 40 y.o. male who presents for His concerns today include: ***  WEIGHT LOSS Duration: {Blank single:19197::"days","weeks","months"} Amount of weight loss:  Fevers: {Blank single:19197::"yes","no"} Decreased appetite: {Blank single:19197::"yes","no"} Night sweats: {Blank single:19197::"yes","no"} Dysphagia/odynophagia: {Blank single:19197::"yes","no"} Chest pain: {Blank single:19197::"yes","no"} Shortness of breath: {Blank single:19197::"yes","no"} Cough: {Blank single:19197::"yes","no"} Nausea: {Blank single:19197::"yes","no"} Vomiting: {Blank single:19197::"yes","no"} Abdominal pain: {Blank single:19197::"yes","no"} Blood in stool: {Blank single:19197::"yes","no"} Easy bruising/bleeding: {Blank single:19197::"yes","no"} Jaundice: {Blank single:19197::"yes","no"} Polydipsia/polyuria: {Blank single:19197::"yes","no"} Depression: {Blank single:19197::"yes","no"} Previous colonoscopy: {Blank single:19197::"yes","no"}  ANXIETY: Duration:{Blank single:19197::"controlled","uncontrolled","better","worse","exacerbated","stable"} Anxious mood: {Blank single:19197::"yes","no"}  Excessive worrying: {Blank single:19197::"yes","no"} Irritability: {Blank single:19197::"yes","no"}  Sweating: {Blank single:19197::"yes","no"} Nausea: {Blank single:19197::"yes","no"} Palpitations:{Blank single:19197::"yes","no"} Hyperventilation: {Blank single:19197::"yes","no"} Panic attacks: {Blank single:19197::"yes","no"} Agoraphobia (fear of places and situations that might cause panic, helplessness, or embarrassment): {Blank single:19197::"yes","no"}  Obscessions/compulsions: {Blank single:19197::"yes","no"} Depressed mood: {Blank single:19197::"yes","no"} Depression screen [facility] 2/9 05/30/2020 05/30/2020 05/21/2020  Decreased Interest 0 0 0   Down, Depressed, Hopeless 0 0 0  PHQ - 2 Score 0 0 0  Altered sleeping 1 0 0  Tired, decreased energy 0 0 0  Change in appetite 2 0 0  Feeling bad or failure about yourself  0 0 0  Trouble concentrating 3 0 0  Moving slowly or fidgety/restless 3 0 0  Suicidal thoughts 0 0 0  PHQ-9 Score 9 0 0  Difficult doing work/chores Very difficult Not difficult at all Not difficult at all  Some encounter information is confidential and restricted. Go to Review Flowsheets activity to see all data.   Anhedonia (an inability to experience pleasure from activities usually found enjoyable): {Blank single:19197::"yes","no"} Weight changes: {Blank single:19197::"yes","no"} Insomnia: {Blank single:19197::"yes","no"} {Blank single:19197::"hard to fall asleep","hard to stay asleep"}  Hypersomnia: {Blank single:19197::"yes","no"} Fatigue/loss of energy: {Blank single:19197::"yes","no"} Feelings of worthlessness: {Blank single:19197::"yes","no"} Feelings of guilt: {Blank single:19197::"yes","no"} Impaired concentration/indecisiveness: {Blank single:19197::"yes","no"} Suicidal ideations, homicidal ideations, self-harm: {Blank single:19197::"yes","no"}  Crying spells: {Blank single:19197::"yes","no"} Recent Stressors/Life Changes: {Blank single:19197::"yes","no"}   Relationship problems: {Blank single:19197::"yes","no"}   Family stress: {Blank single:19197::"yes","no"}     Financial stress: {Blank single:19197::"yes","no"}    Job stress: {Blank single:19197::"yes","no"}    Recent death/loss: {Blank single:19197::"yes","no"}    Patient Active Problem List   Diagnosis Date Noted   History of Clostridioides difficile colitis 02/24/2021   Hypertension 02/17/2021   Human monkeypox 02/06/2021   On pre-exposure prophylaxis for HIV 02/06/2021   GERD (gastroesophageal reflux disease) 06/10/2020   Diarrhea 06/10/2020   Mixed hyperlipidemia 04/09/2020   Attention deficit hyperactivity disorder (ADHD),  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   Generalized anxiety disorder 02/19/2020   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 90 tablet 3   busPIRone (BUSPAR) 10 MG tablet TAKE 1 TABLET BY MOUTH THREE TIMES DAILY 90 tablet 0   famotidine (PEPCID) 40 MG tablet Take 1 tablet by mouth once daily 30 tablet 3   fluticasone (FLONASE) 50 MCG/ACT nasal spray Place 2 sprays into both nostrils daily. 16 g 6   hydrochlorothiazide (HYDRODIURIL) 12.5 MG tablet Take 1 tablet (12.5 mg total) by mouth daily. 30 tablet 1   pantoprazole (PROTONIX) 40  MG tablet Take 1 tablet by mouth once daily 90 tablet 3   sildenafil (VIAGRA) 100 MG tablet Take 0.5-1 tablets (50-100 mg total) by mouth daily as needed for erectile dysfunction. 6 tablet 5   traZODone (DESYREL) 50 MG tablet TAKE 1 TABLET BY MOUTH AT BEDTIME 30 tablet 0   No current facility-administered medications on file prior to visit.    Allergies  Allergen Reactions   Penicillins Anaphylaxis    Social History   Socioeconomic History   Marital status: Soil scientist    Spouse name: Not on file   Number of children: Not on file   Years of education: Not on file   Highest education level: Not on file  Occupational History   Occupation: bartender  Tobacco Use   Smoking status: Every Day    Types: Cigarettes   Smokeless tobacco: Never  Vaping Use   Vaping Use: Never used  Substance and Sexual Activity   Alcohol use: Never   Drug use: Yes    Types: Methamphetamines   Sexual activity: Yes  Other Topics Concern   Not on file  Social History Narrative   Not on file   Social Determinants of Health   Financial Resource Strain:  Not on file  Food Insecurity: Not on file  Transportation Needs: Not on file  Physical Activity: Not on file  Stress: Not on file  Social Connections: Not on file  Intimate Partner Violence: Not on file    Family History  Problem Relation Age of Onset   Heart disease Mother    Heart disease Father    Diabetes Paternal Grandmother     Past Surgical History:  Procedure Laterality Date   FOOT SURGERY     bilateral hammer toe   TONSILLECTOMY      ROS: Review of Systems Negative except as stated above  PHYSICAL EXAM: There were no vitals taken for this visit.  Physical Exam  {male adult master:310786} {male adult master:310785}  CMP Latest Ref Rng & Units 01/31/2021 06/10/2020 04/27/2020  Glucose 70 - 99 mg/dL 114(H) 99 92  BUN 6 - 24 mg/dL 19 21 15   Creatinine 0.76 - 1.27 mg/dL 1.08 1.15 1.04  Sodium 134 - 144 mmol/L 136 139 138  Potassium 3.5 - 5.2 mmol/L 4.5 4.1 3.1(L)  Chloride 96 - 106 mmol/L 104 105 109  CO2 20 - 29 mmol/L 17(L) 27 18(L)  Calcium 8.7 - 10.2 mg/dL 9.2 9.4 7.7(L)  Total Protein 6.0 - 8.5 g/dL 6.6 7.1 5.7(L)  Total Bilirubin 0.0 - 1.2 mg/dL 0.5 1.1 1.7(H)  Alkaline Phos 44 - 121 IU/L 94 72 54  AST 0 - 40 IU/L 24 38(H) 35  ALT 0 - 44 IU/L 28 67(H) 40   Lipid Panel     Component Value Date/Time   CHOL 206 (H) 02/17/2021 1135   CHOL 283 (H) 04/04/2020 0848   TRIG 78 02/17/2021 1135   HDL 55 02/17/2021 1135   HDL 37 (L) 04/04/2020 0848   CHOLHDL 3.7 02/17/2021 1135   LDLCALC 134 (H) 02/17/2021 1135    CBC    Component Value Date/Time   WBC 5.4 01/31/2021 1854   WBC 8.7 06/10/2020 1517   RBC 4.16 01/31/2021 1854   RBC 4.29 06/10/2020 1517   HGB 13.4 01/31/2021 1854   HCT 37.5 01/31/2021 1854   PLT 163 01/31/2021 1854   MCV 90 01/31/2021 1854   MCH 32.2 01/31/2021 1854   MCH 33.0 04/27/2020 2251   MCHC 35.7  01/31/2021 1854   MCHC 35.5 06/10/2020 1517   RDW 12.6 01/31/2021 1854   LYMPHSABS 1.0 01/31/2021 1854   MONOABS 0.8  06/10/2020 1517   EOSABS 0.2 01/31/2021 1854   BASOSABS 0.0 01/31/2021 1854    ASSESSMENT AND PLAN:  There are no diagnoses linked to this encounter.   Patient was given the opportunity to ask questions.  Patient verbalized understanding of the plan and was able to repeat key elements of the plan. Patient was given clear instructions to go to Emergency Department or return to medical center if symptoms don't improve, worsen, or new problems develop.The patient verbalized understanding.   No orders of the defined types were placed in this encounter.    Requested Prescriptions    No prescriptions requested or ordered in this encounter    No follow-ups on file.  Rema Fendt, NP

## 2021-04-16 NOTE — Progress Notes (Signed)
Erroneous encounter

## 2021-04-18 ENCOUNTER — Encounter: Payer: 59 | Admitting: Family

## 2021-04-22 ENCOUNTER — Other Ambulatory Visit: Payer: Self-pay | Admitting: Internal Medicine

## 2021-04-22 ENCOUNTER — Ambulatory Visit: Payer: 59 | Admitting: Family

## 2021-04-22 ENCOUNTER — Other Ambulatory Visit (HOSPITAL_COMMUNITY): Payer: Self-pay | Admitting: Psychiatry

## 2021-04-22 DIAGNOSIS — F323 Major depressive disorder, single episode, severe with psychotic features: Secondary | ICD-10-CM

## 2021-04-27 ENCOUNTER — Telehealth: Payer: 59 | Admitting: Nurse Practitioner

## 2021-04-27 ENCOUNTER — Other Ambulatory Visit: Payer: Self-pay | Admitting: Nurse Practitioner

## 2021-04-27 DIAGNOSIS — T300 Burn of unspecified body region, unspecified degree: Secondary | ICD-10-CM | POA: Diagnosis not present

## 2021-04-27 MED ORDER — MUPIROCIN 2 % EX OINT: 1.0000 "application " | TOPICAL_OINTMENT | Freq: Two times a day (BID) | CUTANEOUS | 0 refills | Status: DC

## 2021-04-27 MED ORDER — MUPIROCIN CALCIUM 2 % EX CREA: 1.0000 "application " | TOPICAL_CREAM | Freq: Two times a day (BID) | CUTANEOUS | 0 refills | Status: DC

## 2021-04-27 NOTE — Progress Notes (Signed)
I have spent 5 minutes in review of e-visit questionnaire, review and updating patient chart, medical decision making and response to patient.  ° °Sapna Padron W Tyquez Hollibaugh, NP ° °  °

## 2021-04-27 NOTE — Progress Notes (Signed)
E-VISIT for Burn  We are sorry that you are not feeling well. Here is how we plan to help!  Based on what you have shared with me it looks like you may have:  2nd degree burn with or without blisters.   Second-degree burns take 14-21 days to heal.  After the burn has healed the skin may look a little darker or lighter than before., I have prescribed Mupirocin 2% ointment.  Apply with gloves to the affected area two times per day for 14 days.  Apply a non-stick dressing such as Telfa to the site after you apply the cream.  You may hold the dressing in place with either paper tape or self adhering wrap such as Coban.  Product similar to Telfa and Coban can be bought at any pharmacy.  If you have a question ask your pharmacist.  For scarring you can try mederma scar gel over the counter or aquaphor  Burns are a type of painful wound caused by thermal, electrical, chemical, or electromagnetic energy.  Smoking and open flame are the leading cause of burn injury for older adults.  Scalding from a hot liquid is the leading cause of burn injury for children.  Both infants and older adults are the greatest risk for burn injury.  First degree burns effect only the outer layers of the skin.  The burn may be red and painful but the skin does not blister.  Long term tissue damage is rare.  Second degree burns involve the surface of the skin and the adjacent skin layers.  The burn sire also appears red and painful and the skin often swells and/or blisters.  Third degree burns destroy both layers of the skin and can also penetrate to underlying  Structures.  A third degree burn may not initially hurt because nerve endings were destroyed.  All third degree burns should be evaluated in person.  HOME CARE:  Wash the area gently with soap and water once a day Apply antibiotic ointment directly to a Band-Aid or dressing and apply Band-Aid or dressing over the burn.  Change dressing every other day.  Use warm water  and 1 or 2 wipes with a wet washcloth to remove any surface debris.  Some of the newer antibiotic ointments contain lidocaine that can help to control the localized pain of the burn. You should leave intact blisters alone for the first 7 days.  After a week you may gently remove blisters.  The easiest way to do this is gently wipe away the dead skin with wet gauze or wet washcloth. If that fails you may carefully trim off the dead skin with a pair of fine scissors.  Be sure to clean the scissors in alcohol before use.  GET HELP RIGHT AWAY IF:  The area of the burn is larger than 4 palms of our hand. You become short of breath. The site looks infected. Your symptoms persist after you have completed your treatment plan. The burn has not healed in 14 days.   MAKE SURE YOU:  Understand these instructions. Will watch your condition. Will get help right away if you are not doing well or get worse.  Your e-visit answers were reviewed by a board certified advanced clinical practitioner to complete your personal care plan.  Depending upon the condition, your plan could have included both over the counter or prescription medications.    Please review your pharmacy choice.  Make sure the pharmacy is open so you can pick  up prescription now.   If there is a problem, you may contact your provider through [employer] and have the prescription routed to another pharmacy. Your safety is important to [place].  If you have drug allergies check your prescription carefully.    For the next 24 hours you can use MyChart to ask questions about today's visit, request a non-urgent call back, or ask for a work or school excuse.  You will get an email with a link to our survey asking about your experience.  I hope that your e-visit has been valuable and will speed your recovery.     Thank you for using e-Visits.

## 2021-04-28 ENCOUNTER — Other Ambulatory Visit: Payer: Self-pay | Admitting: Nurse Practitioner

## 2021-05-02 ENCOUNTER — Telehealth: Payer: 59 | Admitting: Family

## 2021-05-02 DIAGNOSIS — R051 Acute cough: Secondary | ICD-10-CM

## 2021-05-02 MED ORDER — BENZONATATE 100 MG PO CAPS: 100.0000 mg | ORAL_CAPSULE | Freq: Three times a day (TID) | ORAL | 0 refills | Status: DC | PRN

## 2021-05-02 NOTE — Progress Notes (Signed)
We are sorry that you are not feeling well.  Here is how we plan to help!  Based on your presentation I believe you most likely have A cough due to a virus.  This is called viral bronchitis and is best treated by rest, plenty of fluids and control of the cough.  You may use Ibuprofen or Tylenol as directed to help your symptoms.    I recommend you to do COVID test to rule out.    In addition you may use A non-prescription cough medication called Robitussin DAC. Take 2 teaspoons every 8 hours or Delsym: take 2 teaspoons every 12 hours., A non-prescription cough medication called Mucinex DM: take 2 tablets every 12 hours., and A prescription cough medication called Tessalon Perles 100mg . You may take 1-2 capsules every 8 hours as needed for your cough.   From your responses in the eVisit questionnaire you describe inflammation in the upper respiratory tract which is causing a significant cough.  This is commonly called Bronchitis and has four common causes:   Allergies Viral Infections Acid Reflux Bacterial Infection Allergies, viruses and acid reflux are treated by controlling symptoms or eliminating the cause. An example might be a cough caused by taking certain blood pressure medications. You stop the cough by changing the medication. Another example might be a cough caused by acid reflux. Controlling the reflux helps control the cough.  USE OF BRONCHODILATOR ("RESCUE") INHALERS: There is a risk from using your bronchodilator too frequently.  The risk is that over-reliance on a medication which only relaxes the muscles surrounding the breathing tubes can reduce the effectiveness of medications prescribed to reduce swelling and congestion of the tubes themselves.  Although you feel brief relief from the bronchodilator inhaler, your asthma may actually be worsening with the tubes becoming more swollen and filled with mucus.  This can delay other crucial treatments, such as oral steroid medications.  If you need to use a bronchodilator inhaler daily, several times per day, you should discuss this with your provider.  There are probably better treatments that could be used to keep your asthma under control.     HOME CARE Only take medications as instructed by your medical team. Complete the entire course of an antibiotic. Drink plenty of fluids and get plenty of rest. Avoid close contacts especially the very young and the elderly Cover your mouth if you cough or cough into your sleeve. Always remember to wash your hands A steam or ultrasonic humidifier can help congestion.   GET HELP RIGHT AWAY IF: You develop worsening fever. You become short of breath You cough up blood. Your symptoms persist after you have completed your treatment plan MAKE SURE YOU  Understand these instructions. Will watch your condition. Will get help right away if you are not doing well or get worse.    Thank you for choosing an e-visit.  Your e-visit answers were reviewed by a board certified advanced clinical practitioner to complete your personal care plan. Depending upon the condition, your plan could have included both over the counter or prescription medications.  Please review your pharmacy choice. Make sure the pharmacy is open so you can pick up prescription now. If there is a problem, you may contact your provider through and have the prescription routed to another pharmacy.  Your safety is important to [employer]. If you have drug allergies check your prescription carefully.   For the next 24 hours you can use MyChart to ask questions about  today's visit, request a non-urgent call back, or ask for a work or school excuse. You will get an email in the next two days asking about your experience. I hope that your e-visit has been valuable and will speed your recovery.  Approximately 5 minutes was spent documenting and reviewing patient's chart.

## 2021-05-08 ENCOUNTER — Telehealth: Payer: Self-pay

## 2021-05-08 ENCOUNTER — Other Ambulatory Visit: Payer: Self-pay

## 2021-05-08 ENCOUNTER — Encounter: Payer: Self-pay | Admitting: Pharmacist

## 2021-05-08 ENCOUNTER — Ambulatory Visit (INDEPENDENT_AMBULATORY_CARE_PROVIDER_SITE_OTHER): Payer: 59 | Admitting: Pharmacist

## 2021-05-08 ENCOUNTER — Other Ambulatory Visit: Payer: 59

## 2021-05-08 DIAGNOSIS — B20 Human immunodeficiency virus [HIV] disease: Secondary | ICD-10-CM

## 2021-05-08 DIAGNOSIS — Z79899 Other long term (current) drug therapy: Secondary | ICD-10-CM | POA: Diagnosis not present

## 2021-05-08 MED ORDER — CABOTEGRAVIR ER 600 MG/3ML IM SUER
600.0000 mg | Freq: Once | INTRAMUSCULAR | Status: AC
  Administered 2021-05-08: 600 mg via INTRAMUSCULAR

## 2021-05-08 NOTE — Progress Notes (Signed)
HPI: Billy Mann is a 41 y.o. male who presents to the [place] [facility] for Apretude administration and HIV PrEP follow up.  Patient Active Problem List   Diagnosis Date Noted   History of Clostridioides difficile colitis 02/24/2021   Hypertension 02/17/2021   Human monkeypox 02/06/2021   On pre-exposure prophylaxis for HIV 02/06/2021   GERD (gastroesophageal reflux disease) 06/10/2020   Diarrhea 06/10/2020   Mixed hyperlipidemia 04/09/2020   Attention deficit hyperactivity disorder (ADHD), 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   Generalized anxiety disorder 02/19/2020   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[place]) 02/19/2020   Obsessive-compulsive personality disorder ([place]) 06/12/2019   Diabetes mellitus type 2, controlled ([place]) 05/18/2018    Patient's Medications  New Prescriptions   No medications on file  Previous Medications   ATORVASTATIN (LIPITOR) 20 MG TABLET    Take 1 tablet (20 mg total) by mouth daily.   BENZONATATE (TESSALON PERLES) 100 MG CAPSULE    Take 1 capsule (100 mg total) by mouth 3 (three) times daily as needed.   BUSPIRONE (BUSPAR) 10 MG TABLET    TAKE 1 TABLET BY MOUTH THREE TIMES DAILY   FAMOTIDINE (PEPCID) 40 MG TABLET    Take 1 tablet (40 mg total) by mouth daily. APPOINTMENT NEED FOR REFILLS   FLUTICASONE (FLONASE) 50 MCG/ACT NASAL SPRAY    Place 2 sprays into both nostrils daily.   HYDROCHLOROTHIAZIDE (HYDRODIURIL) 12.5 MG TABLET    Take 1 tablet (12.5 mg total) by mouth daily.   MUPIROCIN OINTMENT (BACTROBAN) 2 %    Apply 1 application topically 2 (two) times daily.   PANTOPRAZOLE (PROTONIX) 40 MG TABLET    Take 1 tablet by mouth once daily   SILDENAFIL (VIAGRA) 100 MG TABLET    Take 0.5-1 tablets (50-100 mg total) by mouth daily as needed for erectile dysfunction.   TRAZODONE (DESYREL) 50 MG TABLET    TAKE 1 TABLET BY MOUTH AT BEDTIME  Modified Medications   No medications on file  Discontinued Medications   No medications on  file    Allergies: Allergies  Allergen Reactions   Penicillins Anaphylaxis    Past Medical History: Past Medical History:  Diagnosis Date   Anxiety    Phreesia 03/30/2020   Anxiety    Depression    Phreesia 03/30/2020   Depression    GERD (gastroesophageal reflux disease)    Phreesia 03/30/2020   High cholesterol    Hyperlipidemia    Phreesia 03/30/2020   Hypertension    Phreesia 03/30/2020   Sleep apnea    Phreesia 03/30/2020    Social History: Social History   Socioeconomic History   Marital status: Soil scientist    Spouse name: Not on file   Number of children: Not on file   Years of education: Not on file   Highest education level: Not on file  Occupational History   Occupation: bartender  Tobacco Use   Smoking status: Every Day    Types: Cigarettes   Smokeless tobacco: Never  Vaping Use   Vaping Use: Never used  Substance and Sexual Activity   Alcohol use: Never   Drug use: Yes    Types: Methamphetamines   Sexual activity: Yes  Other Topics Concern   Not on file  Social History Narrative   Not on file   Social Determinants of Health   Financial Resource Strain: Not on file  Food Insecurity: Not on file  Transportation Needs: Not on file  Physical Activity: Not on file  Stress: Not on file  Social Connections: Not on file    Labs: Lab Results  Component Value Date   HIV1RNAQUANT Not Detected 02/24/2021    RPR and STI Lab Results  Component Value Date   LABRPR Non Reactive 01/31/2021    No flowsheet data found.  Hepatitis B No results found for: HEPBSAB, HEPBSAG, HEPBCAB Hepatitis C No results found for: HEPCAB, HCVRNAPCRQN Hepatitis A No results found for: HAV Lipids: Lab Results  Component Value Date   CHOL 206 (H) 02/17/2021   TRIG 78 02/17/2021   HDL 55 02/17/2021   CHOLHDL 3.7 02/17/2021   LDLCALC 134 (H) 02/17/2021    TARGET DATE: The 19th of the month  Current PrEP Regimen: Apretude  Assessment: Billy Mann  presents today for their first initiation injection of Apretude and to follow up for HIV PrEP. He previously started Apretude on November 7th but was unable to receive his second loading dose due to testing positive for COVID in December. As it has been over 2 months since his first Apretude injection, he will have to restart the loading dose series today with another shot in a month. He states he has fully recovered from [place]. Of note, he has one exclusive partner but has not been sexually active in over 1 month due to both of them having COVID. Thus, will not check for any STIs today; will follow-up on these next time. States he did not experience any soreness or side effects from his first shot in November.  Explained that showing up to injection appointments is very important and warned that if appointments are missed, protection will be minimal and the risk of acquiring HIV becomes much higher. Counseled on possible side effects associated with the injections such as injection site pain, which is usually mild to moderate in nature, injection site nodules, and injection site reactions. Asked to call the clinic or send me a mychart message if they experience any issues. Advised that they can take Motrin or Tylenol for injection site pain if needed. They may also pre-treat with Motrin or Tylenol about 30-45 minutes before scheduled appointments.   Administered cabotegravir 600mg /74mL in right upper outer quadrant of the gluteal muscle. Monitored patient for 10 minutes after injection. Injections were tolerated well without issue. Will make follow up appointments for second initiation injection in 30 days and then maintenance injections every 2 months thereafter.   Plan: - First Apretude injection administered - Second initiation injection scheduled for 2/15 with me - Maintenance injections scheduled for 4/12 and 6/14 with me - Call with any issues or questions  Alfonse Spruce, PharmD, CPP Clinical  Pharmacist Practitioner [facility]

## 2021-05-08 NOTE — Telephone Encounter (Signed)
Called patient to see if he could return to office to fill out form form Apertude injection. Left voicemail requesting call back or to stop by office today to complete form.  Form is with Wilkie Aye in lab.  Juanita Laster, RMA

## 2021-05-10 LAB — HIV-1 RNA QUANT-NO REFLEX-BLD
HIV 1 RNA Quant: NOT DETECTED Copies/mL
HIV-1 RNA Quant, Log: NOT DETECTED Log cps/mL

## 2021-05-14 ENCOUNTER — Other Ambulatory Visit: Payer: Self-pay

## 2021-05-14 ENCOUNTER — Ambulatory Visit: Payer: 59 | Admitting: Physician Assistant

## 2021-05-14 ENCOUNTER — Ambulatory Visit: Payer: 59 | Admitting: Podiatry

## 2021-05-14 ENCOUNTER — Ambulatory Visit (INDEPENDENT_AMBULATORY_CARE_PROVIDER_SITE_OTHER): Payer: 59

## 2021-05-14 DIAGNOSIS — M2041 Other hammer toe(s) (acquired), right foot: Secondary | ICD-10-CM | POA: Diagnosis not present

## 2021-05-14 DIAGNOSIS — M21619 Bunion of unspecified foot: Secondary | ICD-10-CM

## 2021-05-14 DIAGNOSIS — M79671 Pain in right foot: Secondary | ICD-10-CM

## 2021-05-14 DIAGNOSIS — M2011 Hallux valgus (acquired), right foot: Secondary | ICD-10-CM | POA: Diagnosis not present

## 2021-05-14 DIAGNOSIS — M21611 Bunion of right foot: Secondary | ICD-10-CM | POA: Diagnosis not present

## 2021-05-15 ENCOUNTER — Other Ambulatory Visit: Payer: Self-pay | Admitting: Podiatry

## 2021-05-15 DIAGNOSIS — M21619 Bunion of unspecified foot: Secondary | ICD-10-CM

## 2021-05-20 ENCOUNTER — Ambulatory Visit (INDEPENDENT_AMBULATORY_CARE_PROVIDER_SITE_OTHER): Payer: 59 | Admitting: Nurse Practitioner

## 2021-05-20 ENCOUNTER — Telehealth: Payer: Self-pay | Admitting: Internal Medicine

## 2021-05-20 ENCOUNTER — Other Ambulatory Visit: Payer: Self-pay

## 2021-05-20 DIAGNOSIS — N529 Male erectile dysfunction, unspecified: Secondary | ICD-10-CM

## 2021-05-20 DIAGNOSIS — F419 Anxiety disorder, unspecified: Secondary | ICD-10-CM

## 2021-05-20 DIAGNOSIS — I10 Essential (primary) hypertension: Secondary | ICD-10-CM | POA: Diagnosis not present

## 2021-05-20 DIAGNOSIS — F32A Depression, unspecified: Secondary | ICD-10-CM

## 2021-05-20 MED ORDER — HYDROCHLOROTHIAZIDE 12.5 MG PO TABS: 12.5000 mg | ORAL_TABLET | Freq: Every day | ORAL | 1 refills | Status: DC

## 2021-05-20 MED ORDER — SILDENAFIL CITRATE 100 MG PO TABS: 50.0000 mg | ORAL_TABLET | Freq: Every day | ORAL | 5 refills | Status: DC | PRN

## 2021-05-20 NOTE — Telephone Encounter (Signed)
Billy Mann had virtual visit with the pt on 05/20/21 and is requesting the patient have an appt with you to discuss his mental health concerns

## 2021-05-20 NOTE — Progress Notes (Signed)
Subjective: 41 y.o. male presenting today for evaluation of a bunion that is developed relatively fast over the past 2 years.  He does have history of hammertoe correction to the digits 2-5 of the bilateral feet on 01/25/2020.  Patient noticed that shortly after surgery he began to develop a bunion to the right foot which has progressed significantly since surgery.  This is caused pressure against the lesser digits of the foot and caused recurrent hammertoes.  He says that his left foot is doing very well.  He says that the hammertoes are very painful and hurts during ambulation and walking despite shoe gear modifications, cushioning, and gel pads.  He presents for further treatment and evaluation  Past Medical History:  Diagnosis Date   Anxiety    Phreesia 03/30/2020   Anxiety    Depression    Phreesia 03/30/2020   Depression    GERD (gastroesophageal reflux disease)    Phreesia 03/30/2020   High cholesterol    Hyperlipidemia    Phreesia 03/30/2020   Hypertension    Phreesia 03/30/2020   Sleep apnea    Phreesia 03/30/2020   Past Surgical History:  Procedure Laterality Date   FOOT SURGERY     bilateral hammer toe   TONSILLECTOMY     Allergies  Allergen Reactions   Penicillins Anaphylaxis   Objective: Physical Exam General: The patient is alert and oriented x3 in no acute distress.  Dermatology: Skin is cool, dry and supple bilateral lower extremities. Negative for open lesions or macerations.  Vascular: Palpable pedal pulses bilaterally. No edema or erythema noted. Capillary refill within normal limits.  Neurological: Epicritic and protective threshold grossly intact bilaterally.   Musculoskeletal Exam: Clinical evidence of bunion deformity noted to the respective foot. There is moderate pain on palpation range of motion of the first MPJ. Lateral deviation of the hallux noted consistent with hallux abductovalgus. Hammertoe contracture also noted on clinical exam to digits  2-5 of the right foot. Symptomatic pain on palpation and range of motion also noted to the metatarsal phalangeal joints of the respective hammertoe digits.    Radiographic Exam: Increased intermetatarsal angle greater than 15 with a hallux abductus angle greater than 30 noted on AP view. Moderate degenerative changes noted within the first MPJ. Contracture deformity also noted to the interphalangeal joints and MPJs of the digits of the respective hammertoes.  Arthroplasty noted to the lesser digits with some osseous regrowth and spurring especially to the second and third digit arthroplasty sites  Assessment: 1. HAV w/ bunion deformity right 2.  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 Patient was evaluated. X-Rays reviewed. 2.  Today the patient states that he would like to have surgery to the right foot bunion area as well as correction of the recurrent hammertoes.  He is concerned due to the progression of the bunion over a relatively short period of time. Today we discussed the conservative versus surgical management of the presenting pathology. The patient opts for surgical management. All possible complications and details of the procedure were explained. All patient questions were answered. No guarantees were expressed or implied. 3. Authorization for surgery was initiated today. Surgery will consist of bunionectomy with osteotomy right.  Revisional hammertoe arthroplasty digits 2-5 right.  Possible Weil shortening osteotomy 2, 3 right 4.  Return to clinic 1 week postop  *Works at [employer] @ [place] [address]    Edrick Kins, DPM [facility]  Dr.  Edrick Kins, DPM    [address]                                        [place], [place] [address]                Office [phone]  Fax [phone]

## 2021-05-20 NOTE — Patient Instructions (Addendum)
Hypertension Anxiety and depression ED:  Will refill medications as requested  Will schedule an appointment with counselor here in office for anxiety and depression  Will place referral to psychiatry  Stay active  Heart healthy diet  Follow up:  Follow up in 4-6 weeks for hypertension follow up with [name]

## 2021-05-20 NOTE — Progress Notes (Signed)
Virtual Visit via Telephone Note  I connected with Billy Mann on 05/20/21 at  3:20 PM EST by telephone and verified that I am speaking with the correct person using two identifiers.  Location: Patient: home Provider: office   I discussed the limitations, risks, security and privacy concerns of performing an evaluation and management service by telephone and the availability of in person appointments. I also discussed with the patient that there may be a patient responsible charge related to this service. The patient expressed understanding and agreed to proceed.   History of Present Illness:  Patient presents today for medication refill.  Patient states that he needs HydroDIURIL and sildenafil refilled today.  Patient states that he has not been checking his blood pressures at home because his home blood pressure cuff is not working currently.  Patient states that he is currently in between psychiatrist.  He does have a history of anxiety and depression.  We discussed that we can refer him to psychiatry and get him set up with an appointment to see a counselor in our office.  This is a former patient of Dr. Earlene Mann.  We will get him set up to follow-up with Billy Mann within 4 to 6 weeks.  Patient is due for repeat blood work. Denies f/c/s, n/v/d, hemoptysis, PND, chest pain or edema.     Observations/Objective:  Vitals with BMI 02/24/2021 02/17/2021 02/17/2021  Height - - -  Weight 170 lbs - -  BMI - - -  Systolic 128 171 333  Diastolic 82 34 119  Pulse 98 - 84      Assessment and Plan:  Hypertension Anxiety and depression ED:  Will refill medications as requested  Will schedule an appointment with counselor here in office for anxiety and depression  Will place referral to psychiatry  Stay active  Heart healthy diet  Follow up:  Follow up in 4-6 weeks for hypertension follow up with Billy Mann    I discussed the assessment and treatment plan with the patient. The  patient was provided an opportunity to ask questions and all were answered. The patient agreed with the plan and demonstrated an understanding of the instructions.   The patient was advised to call back or seek an in-person evaluation if the symptoms worsen or if the condition fails to improve as anticipated.  I provided 23 minutes of non-face-to-face time during this encounter.   Ivonne Andrew, NP

## 2021-05-21 ENCOUNTER — Telehealth: Payer: Self-pay | Admitting: Internal Medicine

## 2021-05-21 ENCOUNTER — Encounter: Payer: Self-pay | Admitting: Nurse Practitioner

## 2021-05-21 NOTE — Telephone Encounter (Signed)
Pt needs an urgent appt please

## 2021-05-23 NOTE — Telephone Encounter (Signed)
Pt returned call and scheduled appt for 06/17/21 at 9:30. Denies SI.

## 2021-05-24 ENCOUNTER — Telehealth: Payer: 59 | Admitting: Nurse Practitioner

## 2021-05-24 DIAGNOSIS — J4521 Mild intermittent asthma with (acute) exacerbation: Secondary | ICD-10-CM | POA: Diagnosis not present

## 2021-05-24 MED ORDER — PREDNISONE 20 MG PO TABS: 40.0000 mg | ORAL_TABLET | Freq: Every day | ORAL | 0 refills | Status: AC

## 2021-05-24 MED ORDER — ALBUTEROL SULFATE HFA 108 (90 BASE) MCG/ACT IN AERS: 2.0000 | INHALATION_SPRAY | Freq: Four times a day (QID) | RESPIRATORY_TRACT | 0 refills | Status: DC | PRN

## 2021-05-24 NOTE — Progress Notes (Signed)
Visit for Asthma  Based on what you have shared with me, it looks like you may have a flare up of your asthma.  Asthma is a chronic (ongoing) lung disease which results in airway obstruction, inflammation and hyper-responsiveness.   Asthma symptoms vary from person to person, with common symptoms including nighttime awakening and decreased ability to participate in normal activities as a result of shortness of breath. It is often triggered by changes in weather, changes in the season, changes in air temperature, or inside (home, school, daycare or work) allergens such as animal dander, mold, mildew, woodstoves or cockroaches.   It can also be triggered by hormonal changes, extreme emotion, physical exertion or an upper respiratory tract illness.     It is important to identify the trigger, and then eliminate or avoid the trigger if possible.   If you have been prescribed medications to be taken on a regular basis, it is important to follow the asthma action plan and to follow guidelines to adjust medication in response to increasing symptoms of decreased peak expiratory flow rate  Treatment: I have prescribed: Albuterol (Proventil HFA; Ventolin HFA) 108 (90 Base) MCG/ACT Inhaler 2 puffs into the lungs every six hours as needed for wheezing or shortness of breath and Prednisone 40mg by mouth per day for 5 - 7 days  HOME CARE . Only take medications as instructed by your medical team. . Consider wearing a mask or scarf to improve breathing air temperature have been shown to decrease irritation and decrease exacerbations . Get rest. . Taking a steamy shower or using a humidifier may help nasal congestion sand ease sore throat pain. You can place a towel over your head and breathe in the steam from hot water coming from a faucet. . Using a saline nasal spray works much the same way.  . Cough  drops, hare candies and sore throat lozenges may ease your cough.  . Avoid close contacts especially the very you and the elderly . Cover your mouth if you cough or sneeze . Always remember to wash your hands.    GET HELP RIGHT AWAY IF: . You develop worsening symptoms; breathlessness at rest, drowsy, confused or agitated, unable to speak in full sentences . You have coughing fits . You develop a severe headache or visual changes . You develop shortness of breath, difficulty breathing or start having chest pain . Your symptoms persist after you have completed your treatment plan . If your symptoms do not improve within 10 days  MAKE SURE YOU . Understand these instructions. . Will watch your condition. . Will get help right away if you are not doing well or get worse.   Your e-visit answers were reviewed by a board certified advanced clinical practitioner to complete your personal care plan, Depending upon the condition, your plan could have included both over the counter or prescription medications.  Please review your pharmacy choice. Your safety is important to us. If you have drug allergies check your prescription carefully. You can use MyChart to ask questions about today's visit, request a non-urgent call back, or ask for a work or school excuse for 24 hours related to this e-Visit. If it has been greater than 24 hours you will need to follow up with your provider, or enter a new e-Visit to address those concerns.  You will get an e-mail in the next two days asking about your experience. I hope that your e-visit has been valuable and will speed your recovery.   Thank you for using e-visits.  5-10 minutes spent reviewing and documenting in chart.  

## 2021-05-24 NOTE — Addendum Note (Signed)
Addended by: Chevis Pretty on: 05/24/2021 11:31 AM   Modules accepted: Orders

## 2021-05-26 ENCOUNTER — Telehealth (INDEPENDENT_AMBULATORY_CARE_PROVIDER_SITE_OTHER): Payer: 59 | Admitting: Psychiatry

## 2021-05-26 ENCOUNTER — Telehealth: Payer: 59 | Admitting: Physician Assistant

## 2021-05-26 ENCOUNTER — Encounter (HOSPITAL_COMMUNITY): Payer: Self-pay | Admitting: Psychiatry

## 2021-05-26 DIAGNOSIS — F411 Generalized anxiety disorder: Secondary | ICD-10-CM | POA: Diagnosis not present

## 2021-05-26 DIAGNOSIS — F900 Attention-deficit hyperactivity disorder, predominantly inattentive type: Secondary | ICD-10-CM

## 2021-05-26 DIAGNOSIS — L02519 Cutaneous abscess of unspecified hand: Secondary | ICD-10-CM

## 2021-05-26 DIAGNOSIS — W5503XA Scratched by cat, initial encounter: Secondary | ICD-10-CM

## 2021-05-26 DIAGNOSIS — F3181 Bipolar II disorder: Secondary | ICD-10-CM | POA: Diagnosis not present

## 2021-05-26 DIAGNOSIS — F17200 Nicotine dependence, unspecified, uncomplicated: Secondary | ICD-10-CM

## 2021-05-26 MED ORDER — BUSPIRONE HCL 15 MG PO TABS: 15.0000 mg | ORAL_TABLET | Freq: Three times a day (TID) | ORAL | 3 refills | Status: DC

## 2021-05-26 MED ORDER — QUETIAPINE FUMARATE 50 MG PO TABS: 50.0000 mg | ORAL_TABLET | Freq: Every day | ORAL | 3 refills | Status: DC

## 2021-05-26 MED ORDER — LAMOTRIGINE 25 MG PO TABS: 25.0000 mg | ORAL_TABLET | Freq: Every day | ORAL | 3 refills | Status: DC

## 2021-05-26 MED ORDER — LAMOTRIGINE 25 MG PO TABS: 25.0000 mg | ORAL_TABLET | Freq: Every day | ORAL | 2 refills | Status: DC

## 2021-05-26 MED ORDER — BUPROPION HCL ER (XL) 150 MG PO TB24: 150.0000 mg | ORAL_TABLET | ORAL | 3 refills | Status: DC

## 2021-05-26 NOTE — Progress Notes (Signed)
BH MD/PA/NP OP Progress Note Virtual Visit via Video Note  I connected with [name] on 05/26/21 at  2:30 PM EST by a video enabled telemedicine application and verified that I am speaking with the correct person using two identifiers.  Location: Patient: Home Provider: Clinic   I discussed the limitations of evaluation and management by telemedicine and the availability of in person appointments. The patient expressed understanding and agreed to proceed.  I provided 30 minutes of non-face-to-face time during this encounter.    05/26/2021 2:53 PM Billy Mann  MRN:  [id number]  Chief Complaint:  "I have a hard time waking up with the Seroquel"   "HPI: 41 year old male seen today for follow up psychiatric evaluation.  He has a psychiatric history PTSD, bipolar disorder, alcohol use disorder (in remission), cannabis use disorder (in remission), depression, ADHD, and OCD.  He is currently being managed on trazodone 50 mg, BuSpar 10 mg 3 times daily, and Seroquel 100 mg nightly. He informed provider that he discontinued his trazodone and reports that his other medications are somewhat effective in managing his psychiatric conditions.    Today well-groomed, pleasant, cooperative, engaged in conversation, and maintained eye contact.  He informed provider that he has a hard time waking up with Seroquel.  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  He does note that he gets approximately 6 to 8 hours nightly but notes that he is in a dream state when he wakes up.  Since his last visit he notes that he has been more irritable, having fluctuations in mood, distractible, and having racing thoughts.  He denies impulsive behaviors or other symptoms of mania.  Patient also notes that he has been more anxious.  He informed Probation officer that he and his boyfriend broke up however reports that his ex partner continues to live with him as he has no place to go.  He informed Probation officer that  his ex boyfriend described him as passive-aggressive and notes that he speaks sarcastically to him when they talk.  Provider conducted a GAD-7 and patient scored a 15, at his last visit he scored a 63.  Provider also conducted PHQ-9 he scored a 6, at his last visit he scored a 20.  He endorses decreased appetite noting that he discontinued sugars and sweets.  He informed Probation officer that he lost 8 pounds since his last visit.  Since his last visit he notes that he has not had visual/auditory hallucinations or paranoia.  Patient also endorses symptoms of ADHD such as poor concentration, avoidance of mentally taxing task, disorganization, and forgetfulness.  He notes that Christianne Borrow is not covered by his insurance and requested another medication for ADHD.  Patient informed writer that he has right and left foot pain.  He notes that he has surgery coming up in March and currently manages his pain with Tylenol.  Patient informed Probation officer that he smokes a pack of cigarettes every 3 days.  Today patient agreeable to starting Lamictal 25 mg for 2 weeks, he will increase to 50 mg after 2 weeks.  The dose will increase from 50-75 mg the following two weeks.  At patient's next visit Lamictal will be increased to 100 mg.  BuSpar increased to 15 mg 3 times daily to help manage anxiety and depression.  Seroquel reduced from 100 mg to 50 mg to help manage daytime sedation.  Patient will also start Wellbutrin XL 150 mg to help manage symptoms of ADHD and tobacco  dependence (patient has high blood pressure and currently taking hypertensive medication.  Clonidine/Intuniv not started due this).  Potential side effects of medication and risks vs benefits of treatment vs non-treatment were explained and discussed. All questions were answered.He will continue all other medications as prescribed and follow-up with outpatient counseling for therapy.  No other concerns at this time.   Visit Diagnosis:    ICD-10-CM   1. Attention  deficit hyperactivity disorder (ADHD), predominantly inattentive type  F90.0 buPROPion (WELLBUTRIN XL) 150 MG 24 hr tablet    2. Generalized anxiety disorder  F41.1 busPIRone (BUSPAR) 15 MG tablet    3. Bipolar 2 disorder (HCC)  F31.81 QUEtiapine (SEROQUEL) 50 MG tablet    lamoTRIgine (LAMICTAL) 25 MG tablet    DISCONTINUED: lamoTRIgine (LAMICTAL) 25 MG tablet    4. Tobacco dependence  F17.200 buPROPion (WELLBUTRIN XL) 150 MG 24 hr tablet      Past Psychiatric History: PTSD, bipolar disorder, alcohol use disorder, cannabis use disorder, OCD, and depression Past Medical History:  Past Medical History:  Diagnosis Date   Anxiety    Phreesia 03/30/2020   Anxiety    Depression    Phreesia 03/30/2020   Depression    GERD (gastroesophageal reflux disease)    Phreesia 03/30/2020   High cholesterol    Hyperlipidemia    Phreesia 03/30/2020   Hypertension    Phreesia 03/30/2020   Sleep apnea    Phreesia 03/30/2020    Past Surgical History:  Procedure Laterality Date   FOOT SURGERY     bilateral hammer toe   TONSILLECTOMY      Family Psychiatric History: Patient adopted and does not know biological family history  Family History:  Family History  Problem Relation Age of Onset   Heart disease Mother    Heart disease Father    Diabetes Paternal Grandmother     Social History:  Social History   Socioeconomic History   Marital status: Soil scientist    Spouse name: Not on file   Number of children: Not on file   Years of education: Not on file   Highest education level: Not on file  Occupational History   Occupation: bartender  Tobacco Use   Smoking status: Every Day    Types: Cigarettes   Smokeless tobacco: Never  Vaping Use   Vaping Use: Never used  Substance and Sexual Activity   Alcohol use: Never   Drug use: Yes    Types: Methamphetamines   Sexual activity: Yes  Other Topics Concern   Not on file  Social History Narrative   Not on file   Social  Determinants of Health   Financial Resource Strain: Not on file  Food Insecurity: Not on file  Transportation Needs: Not on file  Physical Activity: Not on file  Stress: Not on file  Social Connections: Not on file    Allergies:  Allergies  Allergen Reactions   Penicillins Anaphylaxis    Metabolic Disorder Labs: Lab Results  Component Value Date   HGBA1C 4.8 02/17/2021   MPG 91 02/17/2021   No results found for: PROLACTIN Lab Results  Component Value Date   CHOL 206 (H) 02/17/2021   TRIG 78 02/17/2021   HDL 55 02/17/2021   CHOLHDL 3.7 02/17/2021   LDLCALC 134 (H) 02/17/2021   LDLCALC 160 (H) 04/04/2020   No results found for: TSH  Therapeutic Level Labs: No results found for: LITHIUM No results found for: VALPROATE No components found for:  CBMZ  Current  Medications: Current Outpatient Medications  Medication Sig Dispense Refill   buPROPion (WELLBUTRIN XL) 150 MG 24 hr tablet Take 1 tablet (150 mg total) by mouth every morning. 30 tablet 3   busPIRone (BUSPAR) 15 MG tablet Take 1 tablet (15 mg total) by mouth 3 (three) times daily. 90 tablet 3   QUEtiapine (SEROQUEL) 50 MG tablet Take 1 tablet (50 mg total) by mouth at bedtime. 30 tablet 3   albuterol (VENTOLIN HFA) 108 (90 Base) MCG/ACT inhaler Inhale 2 puffs into the lungs every 6 (six) hours as needed for wheezing or shortness of breath. 8 g 0   albuterol (VENTOLIN HFA) 108 (90 Base) MCG/ACT inhaler Inhale 2 puffs into the lungs every 6 (six) hours as needed for wheezing or shortness of breath. 8 g 0   atorvastatin (LIPITOR) 20 MG tablet Take 1 tablet (20 mg total) by mouth daily. 90 tablet 3   benzonatate (TESSALON PERLES) 100 MG capsule Take 1 capsule (100 mg total) by mouth 3 (three) times daily as needed. 20 capsule 0   famotidine (PEPCID) 40 MG tablet Take 1 tablet (40 mg total) by mouth daily. APPOINTMENT NEED FOR REFILLS 30 tablet 0   fluticasone (FLONASE) 50 MCG/ACT nasal spray Place 2 sprays into both  nostrils daily. 16 g 6   hydrochlorothiazide (HYDRODIURIL) 12.5 MG tablet Take 1 tablet (12.5 mg total) by mouth daily. 30 tablet 1   lamoTRIgine (LAMICTAL) 25 MG tablet Take 1 tablet (25 mg total) by mouth daily. 120 tablet 2   mupirocin ointment (BACTROBAN) 2 % Apply 1 application topically 2 (two) times daily. 60 g 0   pantoprazole (PROTONIX) 40 MG tablet Take 1 tablet by mouth once daily 90 tablet 3   predniSONE (DELTASONE) 20 MG tablet Take 2 tablets (40 mg total) by mouth daily with breakfast for 5 days. 2 po daily for 5 days 10 tablet 0   sildenafil (VIAGRA) 100 MG tablet Take 0.5-1 tablets (50-100 mg total) by mouth daily as needed for erectile dysfunction. 6 tablet 5   No current facility-administered medications for this visit.     Musculoskeletal: Strength & Muscle Tone:  Unable to assess due to telehealth visit [place]:  Unable to assess due to telehealth visit Patient leans: N/A  Psychiatric Specialty Exam: Review of Systems  There were no vitals taken for this visit.There is no height or weight on file to calculate BMI.  General Appearance: Well Groomed  Eye Contact:  Good  Speech:  Clear and Coherent and Normal Rate  Volume:  Normal  Mood:  Anxious  Affect:  Appropriate and Congruent  Thought Process:  Coherent, Goal Directed and Linear  Orientation:  Full (Time, Place, and Person)  Thought Content: WDL and Logical   Suicidal Thoughts:  No  Homicidal Thoughts:  No  Memory:  Immediate;   Good Recent;   Good Remote;   Good  Judgement:  Good  Insight:  Good  Psychomotor Activity:  Normal  Concentration:  Concentration: Fair and Attention Span: Fair  Recall:  Good  Fund of Knowledge: Good  Language: Good  Akathisia:  No  Handed:  Right  AIMS (if indicated): Note Done  Assets:  Communication Skills Desire for Improvement Financial Resources/Insurance Housing Social Support  ADL's:  Intact  Cognition: WNL  Sleep:  Good   Screenings: GAD-7     Flowsheet Row Video Visit from 05/26/2021 in [facility] Office Visit from 05/30/2020 in Primary Care at [place]  Visit from 05/20/2020 in [facility] Video Visit from 02/19/2020 in [facility]  Total GAD-7 Score 15 13 21 18       PHQ2-9    Flowsheet Row Video Visit from 05/26/2021 in [facility] Office Visit from 05/30/2020 in Primary Care at [place] from 05/21/2020 in Primary Care at [facility] Video Visit from 05/20/2020 in [facility] Video Visit from 02/19/2020 in [facility]  PHQ-2 Total Score 2 0 0 5 1  PHQ-9 Total Score 6 9 0 20 [place] [facility] from 01/31/2021 in [facility] [place] No Risk        Assessment and Plan Patient endorses symptoms of anxiety, tobacco dependence, and hypomania. Today patient agreeable to starting Lamictal 25 mg for 2 weeks, he will increase to 50 mg after 2 weeks.  The dose will increase from 50-75 mg the following two weeks.  At patient's next visit Lamictal will be increased to 100 mg.  BuSpar increased to 15 mg 3 times daily to help manage anxiety and depression.  Seroquel reduced from 100 mg to 50 mg to help manage daytime sedation.  Patient will also start Wellbutrin XL 150 mg to help manage symptoms of ADHD and tobacco dependence.  1. Generalized anxiety disorder  Increase- busPIRone (BUSPAR) 15 MG tablet; Take 1 tablet (15 mg total) by mouth 3 (three) times daily.  Dispense: 90 tablet; Refill: 3  2. Attention deficit hyperactivity disorder (ADHD), predominantly inattentive type  Start- buPROPion (WELLBUTRIN XL) 150 MG 24 hr tablet; Take 1 tablet (150 mg total) by mouth every morning.  Dispense: 30 tablet; Refill: 3  3. Bipolar 2 disorder (HCC)  Decreased- QUEtiapine (SEROQUEL) 50 MG tablet;  Take 1 tablet (50 mg total) by mouth at bedtime.  Dispense: 30 tablet; Refill: 3 *- lamoTRIgine (LAMICTAL) 25 MG tablet; Take 1 tablet (25 mg total) by mouth daily.  Dispense: 120 tablet; Refill: 2  4. Tobacco dependence  Start- buPROPion (WELLBUTRIN XL) 150 MG 24 hr tablet; Take 1 tablet (150 mg total) by mouth every morning.  Dispense: 30 tablet; Refill: 3   Follow up in 3 months Follow up with therapy  Salley Slaughter, NP 05/26/2021, 2:53 PM

## 2021-05-27 ENCOUNTER — Ambulatory Visit: Payer: 59 | Admitting: Pharmacist

## 2021-05-27 ENCOUNTER — Other Ambulatory Visit: Payer: 59

## 2021-05-27 ENCOUNTER — Telehealth: Payer: 59

## 2021-05-27 ENCOUNTER — Telehealth: Payer: 59 | Admitting: Emergency Medicine

## 2021-05-27 DIAGNOSIS — L0291 Cutaneous abscess, unspecified: Secondary | ICD-10-CM

## 2021-05-27 NOTE — Progress Notes (Signed)
Based on what you shared with me, I feel your condition warrants further evaluation and I recommend that you be seen in a face to face visit.   There is definite concern for infection but giving some of the atypical characteristics (growth and swelling) over the area -- you need to be evaluated in person as they may need to drain or culture the area to make sure you get the proper treatments.    NOTE: There will be NO CHARGE for this eVisit   If you are having a true medical emergency please call 911.      For an urgent face to face visit, [place] has six urgent care centers for your convenience:     [facility] at [facility] Directions [phone] [address] [place], [place] [address]    [facility] Health Urgent Care Center [facility]) Get Driving Directions [phone] [address] [place], [place] [address]  [facility] Health Urgent Care Center [facility]) Get Driving Directions [phone] [address] [place],  [place]  [address]  [facility] Health Urgent Care at [facility] Get Driving Directions [phone] [address], [address] [place], [place] [address]   [facility] Health Urgent Care at [facility] Get Driving Directions  [phone] [address] [place], [place] [address]   [facility] at [facility] Directions [phone] [address] [place], [place] [address]  Your MyChart E-visit questionnaire answers were reviewed by a board certified advanced clinical practitioner to complete your personal care plan based on your specific symptoms.  Thank you for using e-Visits.

## 2021-05-27 NOTE — Progress Notes (Signed)
After I verified pt identity with 2 identifiers, and explained to pt limits of virtual urgent care, he elected not to proceed with video visit.   Rica Mast, PhD, FNP-BC

## 2021-05-28 ENCOUNTER — Other Ambulatory Visit: Payer: Self-pay | Admitting: Pharmacist

## 2021-05-28 DIAGNOSIS — Z79899 Other long term (current) drug therapy: Secondary | ICD-10-CM

## 2021-05-29 ENCOUNTER — Ambulatory Visit: Payer: 59 | Admitting: Gastroenterology

## 2021-05-30 ENCOUNTER — Telehealth: Payer: 59 | Admitting: Physician Assistant

## 2021-05-30 DIAGNOSIS — L0291 Cutaneous abscess, unspecified: Secondary | ICD-10-CM

## 2021-05-30 DIAGNOSIS — W5503XA Scratched by cat, initial encounter: Secondary | ICD-10-CM

## 2021-05-30 NOTE — Progress Notes (Signed)
Based on what you shared with me, I feel your condition warrants further evaluation and I recommend that you be seen in a face to face visit.  Unfortunately, this needs to be evaluated in person. We are unable to determine if any infection or parasite is present via any virtual appointment. A culture should be obtained to make sure treatment is appropriate.    NOTE: There will be NO CHARGE for this eVisit   If you are having a true medical emergency please call 911.      For an urgent face to face visit, [place] has six urgent care centers for your convenience:     [facility] Ur[facility] Care Center at [facility] Directions [phone] [address] [place], [place] [address]    [facility] Health Urgent Care Center [facility]) Get Driving Directions [phone] [address] [place], [place] [address]  [facility] Health Urgent Care Center [facility]) Get Driving Directions [phone] [address] [place],  [place]  [address]  [facility] Health Urgent Care at [facility] Get Driving Directions [phone] [address] [place], [place] [address]   [facility] Health Urgent Care at [facility] Get Driving Directions  [phone] [address] [place], [place] [address]   [facility] Health Urgent Care at [facility] Directions [phone] [address] [place], [place] [address]  Your MyChart E-visit questionnaire answers were reviewed by a board certified advanced clinical practitioner to complete your personal care plan based on your specific symptoms.  Thank you for using e-Visits.   I provided 5 minutes of non face-to-face time during this encounter for chart review and documentation.

## 2021-06-04 ENCOUNTER — Other Ambulatory Visit: Payer: 59

## 2021-06-04 ENCOUNTER — Ambulatory Visit: Payer: 59 | Admitting: Pharmacist

## 2021-06-06 ENCOUNTER — Telehealth: Payer: Self-pay | Admitting: Urology

## 2021-06-06 NOTE — Telephone Encounter (Signed)
DOS - 07/03/21  DOUBLE OSTEOTOMY RIGHT --- 28299 METATARSAL OSTEOTOMY 2,3 RIGHT --- 09323 CAPSULOTOMY MPJ RELEASE 2-5 RIGHT --- 28270 HAMMERTOE REPAIR 2-5 RIGHT --- [phone]  Friday [employer] EFFECTIVE DATE -  RECEIVED FAX FROM Friday [employer] STATING THAT CPT CODES 20254, [phone], [phone] AND [phone] HAVE BEEN APPROVED, AUTH # [id number], GOOD FROM 06/05/21 - 09/02/21.

## 2021-06-07 ENCOUNTER — Telehealth: Payer: Self-pay | Admitting: *Deleted

## 2021-06-07 NOTE — Telephone Encounter (Signed)
Need BP and cholesterol meds refilled. Please call.

## 2021-06-09 ENCOUNTER — Other Ambulatory Visit: Payer: Self-pay | Admitting: Nurse Practitioner

## 2021-06-09 DIAGNOSIS — I10 Essential (primary) hypertension: Secondary | ICD-10-CM

## 2021-06-09 MED ORDER — ATORVASTATIN CALCIUM 20 MG PO TABS: 20.0000 mg | ORAL_TABLET | Freq: Every day | ORAL | 0 refills | Status: DC

## 2021-06-09 MED ORDER — HYDROCHLOROTHIAZIDE 12.5 MG PO TABS: 12.5000 mg | ORAL_TABLET | Freq: Every day | ORAL | 1 refills | Status: DC

## 2021-06-09 NOTE — Telephone Encounter (Signed)
Medications have been sent to pharmacy. Please let patient know. Thanks.

## 2021-06-09 NOTE — Telephone Encounter (Signed)
Patient was called and voice msg was left to pick up medication and make appt to see provider if not already done so

## 2021-06-10 ENCOUNTER — Ambulatory Visit: Payer: 59 | Admitting: Pharmacist

## 2021-06-10 ENCOUNTER — Other Ambulatory Visit: Payer: 59

## 2021-06-11 ENCOUNTER — Ambulatory Visit: Payer: 59 | Admitting: Gastroenterology

## 2021-06-17 ENCOUNTER — Institutional Professional Consult (permissible substitution): Payer: 59 | Admitting: Clinical

## 2021-06-18 ENCOUNTER — Other Ambulatory Visit: Payer: Self-pay

## 2021-06-18 ENCOUNTER — Telehealth (HOSPITAL_COMMUNITY): Payer: 59 | Admitting: Psychiatry

## 2021-06-18 ENCOUNTER — Encounter (HOSPITAL_COMMUNITY): Payer: Self-pay

## 2021-06-19 ENCOUNTER — Ambulatory Visit: Payer: 59 | Admitting: Pharmacist

## 2021-06-19 ENCOUNTER — Other Ambulatory Visit: Payer: 59

## 2021-06-23 ENCOUNTER — Ambulatory Visit: Payer: 59 | Admitting: Gastroenterology

## 2021-06-30 ENCOUNTER — Telehealth: Payer: 59 | Admitting: Physician Assistant

## 2021-06-30 DIAGNOSIS — M5441 Lumbago with sciatica, right side: Secondary | ICD-10-CM | POA: Diagnosis not present

## 2021-06-30 DIAGNOSIS — M5442 Lumbago with sciatica, left side: Secondary | ICD-10-CM | POA: Diagnosis not present

## 2021-06-30 MED ORDER — CYCLOBENZAPRINE HCL 10 MG PO TABS: 10.0000 mg | ORAL_TABLET | Freq: Three times a day (TID) | ORAL | 0 refills | Status: DC | PRN

## 2021-06-30 MED ORDER — NAPROXEN 500 MG PO TABS: 500.0000 mg | ORAL_TABLET | Freq: Two times a day (BID) | ORAL | 0 refills | Status: DC

## 2021-06-30 NOTE — Progress Notes (Signed)

## 2021-07-01 NOTE — Progress Notes (Deleted)
? ? ?Patient ID: Billy Mann, male    DOB: [birth year]  MRN: [id number] ? ?CC: Hypertension Follow-Up ? ?Subjective: ?Billy Mann is a 41 y.o. male who presents for hypertension follow-up.  ? ?His concerns today include:  ?HYPERTENSION FOLLOW-UP: ?05/20/2021 with Angus Selleronya Nichols, NP: ?Hydrochlorothiazide  ? ?07/04/2021: ?Currently taking: see medication list ?Have you taken your blood pressure medication today: []  Yes []  No  ?Med Adherence: []  Yes    []  No ?Medication side effects: []  Yes    []  No ?Adherence with salt restriction (low-salt diet): []  Yes    []  No ?Exercise: Yes []  No []  ?Home Monitoring?: []  Yes    []  No ?Monitoring Frequency: []  Yes    []  No ?Home BP results range: []  Yes    []  No ?Smoking []  Yes []  No ?SOB? []  Yes    []  No ?Chest Pain?: []  Yes    []  No ?Leg swelling?: []  Yes    []  No ?Headaches?: []  Yes    []  No ?Dizziness? []  Yes    []  No ?Comments:  ? ? ?2. ANXIETY DEPRESSION FOLLOW-UP: ?05/20/2021 with Angus Selleronya Nichols, NP: ?Scheduled with counselor in office and referred to Psychiatry.  ? ?07/04/2021: ? ?Patient Active Problem List  ? Diagnosis Date Noted  ? Bipolar 2 disorder (HCC) 05/26/2021  ? History of Clostridioides difficile colitis 02/24/2021  ? Hypertension 02/17/2021  ? 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 On pre-exposure prophylaxis for HIV 02/06/2021  ? GERD (gastroesophageal reflux disease) 06/10/2020  ? Diarrhea 06/10/2020  ? Mixed hyperlipidemia 04/09/2020  ? 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 Generalized anxiety disorder 02/19/2020  ? 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  ? 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 Diabetes mellitus type 2, controlled (HCC) 05/18/2018  ?  ? ?Current Outpatient Medications on File Prior to Visit  ?Medication Sig Dispense Refill  ? albuterol (VENTOLIN HFA) 108 (90 Base) MCG/ACT inhaler Inhale 2 puffs into the lungs every 6 (six) hours as needed for  wheezing or shortness of breath. 8 g 0  ? albuterol (VENTOLIN HFA) 108 (90 Base) MCG/ACT inhaler Inhale 2 puffs into the lungs every 6 (six) hours as needed for wheezing or shortness of breath. 8 g 0  ? atorvastatin (LIPITOR) 20 MG tablet Take 1 tablet (20 mg total) by mouth daily. 90 tablet 0  ? buPROPion (WELLBUTRIN XL) 150 MG 24 hr tablet Take 1 tablet (150 mg total) by mouth every morning. 30 tablet 3  ? busPIRone (BUSPAR) 15 MG tablet Take 1 tablet (15 mg total) by mouth 3 (three) times daily. 90 tablet 3  ? cyclobenzaprine (FLEXERIL) 10 MG tablet Take 1 tablet (10 mg total) by mouth 3 (three) times daily as needed for muscle spasms. 30 tablet 0  ? famotidine (PEPCID) 40 MG tablet Take 1 tablet (40 mg total) by mouth daily. APPOINTMENT NEED FOR REFILLS 30 tablet 0  ? hydrochlorothiazide (HYDRODIURIL) 12.5 MG tablet Take 1 tablet (12.5 mg total) by mouth daily. 30 tablet 1  ? lamoTRIgine (LAMICTAL) 25 MG tablet Take 1 tablet (25 mg total) by mouth daily. 120 tablet 2  ? naproxen (NAPROSYN) 500 MG tablet Take 1 tablet (500 mg total) by mouth 2 (two) times daily with a meal. 30 tablet 0  ? pantoprazole (PROTONIX) 40 MG tablet Take 1 tablet by mouth once daily 90 tablet 3  ? QUEtiapine (SEROQUEL) 50 MG tablet Take 1 tablet (50 mg total) by mouth at bedtime. 30  tablet 3  ? sildenafil (VIAGRA) 100 MG tablet Take 0.5-1 tablets (50-100 mg total) by mouth daily as needed for erectile dysfunction. 6 tablet 5  ? ?No current facility-administered medications on file prior to visit.  ? ? ?Allergies  ?Allergen Reactions  ? Penicillins Anaphylaxis  ? ? ?Social History  ? ?Socioeconomic History  ? Marital status: Media planner  ?  Spouse name: Not on file  ? Number of children: Not on file  ? Years of education: Not on file  ? Highest education level: Not on file  ?Occupational History  ? Occupation: bartender  ?Tobacco Use  ? Smoking status: Every Day  ?  Types: Cigarettes  ? Smokeless tobacco: Never  ?Vaping Use  ? Vaping  Use: Never used  ?Substance and Sexual Activity  ? Alcohol use: Never  ? Drug use: Yes  ?  Types: Methamphetamines  ? Sexual activity: Yes  ?Other Topics Concern  ? Not on file  ?Social History Narrative  ? Not on file  ? ?Social Determinants of Health  ? ?Financial Resource Strain: Not on file  ?Food Insecurity: Not on file  ?Transportation Needs: Not on file  ?Physical Activity: Not on file  ?Stress: Not on file  ?Social Connections: Not on file  ?Intimate Partner Violence: Not on file  ? ? ?Family History  ?Problem Relation Age of Onset  ? Heart disease Mother   ? Heart disease Father   ? Diabetes Paternal Grandmother   ? ? ?Past Surgical History:  ?Procedure Laterality Date  ? FOOT SURGERY    ? bilateral hammer toe  ? TONSILLECTOMY    ? ? ?ROS: ?Review of Systems ?Negative except as stated above ? ?PHYSICAL EXAM: ?There were no vitals taken for this visit. ? ?Physical Exam ? ?{male adult master:310786} ?{male adult master:310785} ? ?CMP Latest Ref Rng & Units 01/31/2021 06/10/2020 04/27/2020  ?Glucose 70 - 99 mg/dL 093(O) 99 92  ?BUN 6 - 24 mg/dL 19 21 15   ?Creatinine 0.76 - 1.27 mg/dL 6.71 2.45  ?Sodium 134 - 144 mmol/L 136 139 138  ?Potassium 3.5 - 5.2 mmol/L 4.5 4.1 3.1(L)  ?Chloride 96 - 106 mmol/L 104 105 109  ?CO2 20 - 29 mmol/L 17(L) 27 18(L)  ?Calcium 8.7 - 10.2 mg/dL 9.2 9.4 7.7(L)  ?Total Protein 6.0 - 8.5 g/dL 6.6 7.1 8.09)  ?Total Bilirubin 0.0 - 1.2 mg/dL 0.5 1.1 9.8(P)  ?Alkaline Phos 44 - 121 IU/L 94 72 54  ?AST 0 - 40 IU/L 24 38(H) 35  ?ALT 0 - 44 IU/L 28 67(H) 40  ? ?Lipid Panel  ?   ?Component Value Date/Time  ? CHOL 206 (H) 02/17/2021 1135  ? CHOL 283 (H) 04/04/2020 0848  ? TRIG 78 02/17/2021 1135  ? HDL 55 02/17/2021 1135  ? HDL 37 (L) 04/04/2020 0848  ? CHOLHDL 3.7 02/17/2021 1135  ? LDLCALC 134 (H) 02/17/2021 1135  ? ? ?CBC ?   ?Component Value Date/Time  ? WBC 5.4 01/31/2021 1854  ? WBC 8.7 06/10/2020 1517  ? RBC 4.16 01/31/2021 1854  ? RBC 4.29 06/10/2020 1517  ? HGB 13.4 01/31/2021  1854  ? HCT 37.5 01/31/2021 1854  ? PLT 163 01/31/2021 1854  ? MCV 90 01/31/2021 1854  ? MCH 32.2 01/31/2021 1854  ? MCH 33.0 04/27/2020 2251  ? MCHC 35.7 01/31/2021 1854  ? MCHC 35.5 06/10/2020 1517  ? RDW 12.6 01/31/2021 1854  ? LYMPHSABS 1.0 01/31/2021 1854  ? MONOABS 0.8 06/10/2020 1517  ?  EOSABS 0.2 01/31/2021 1854  ? BASOSABS 0.0 01/31/2021 1854  ? ? ?ASSESSMENT AND PLAN: ? ?There are no diagnoses linked to this encounter. ? ? ?Patient was given the opportunity to ask questions.  Patient verbalized understanding of the plan and was able to repeat key elements of the plan. Patient was given clear instructions to go to Emergency Department or return to medical center if symptoms don't improve, worsen, or new problems develop.The patient verbalized understanding. ? ? ?No orders of the defined types were placed in this encounter. ? ? ? ?Requested Prescriptions  ? ? No prescriptions requested or ordered in this encounter  ? ? ?No follow-ups on file. ? ?Rema Fendt, NP  ?

## 2021-07-03 ENCOUNTER — Other Ambulatory Visit: Payer: Self-pay | Admitting: Podiatry

## 2021-07-03 ENCOUNTER — Encounter: Payer: Self-pay | Admitting: Podiatry

## 2021-07-03 DIAGNOSIS — M2041 Other hammer toe(s) (acquired), right foot: Secondary | ICD-10-CM | POA: Diagnosis not present

## 2021-07-03 DIAGNOSIS — Q6689 Other  specified congenital deformities of feet: Secondary | ICD-10-CM | POA: Diagnosis not present

## 2021-07-03 DIAGNOSIS — M21541 Acquired clubfoot, right foot: Secondary | ICD-10-CM | POA: Diagnosis not present

## 2021-07-03 DIAGNOSIS — M2011 Hallux valgus (acquired), right foot: Secondary | ICD-10-CM | POA: Diagnosis not present

## 2021-07-03 MED ORDER — OXYCODONE-ACETAMINOPHEN 5-325 MG PO TABS: 1.0000 | ORAL_TABLET | ORAL | 0 refills | Status: DC | PRN

## 2021-07-03 MED ORDER — DICLOFENAC SODIUM 75 MG PO TBEC: 75.0000 mg | DELAYED_RELEASE_TABLET | Freq: Two times a day (BID) | ORAL | 1 refills | Status: DC

## 2021-07-03 NOTE — Progress Notes (Signed)
PRN postop 

## 2021-07-04 ENCOUNTER — Telehealth: Payer: Self-pay | Admitting: *Deleted

## 2021-07-04 ENCOUNTER — Ambulatory Visit: Payer: 59 | Admitting: Family

## 2021-07-04 DIAGNOSIS — I10 Essential (primary) hypertension: Secondary | ICD-10-CM

## 2021-07-04 NOTE — Progress Notes (Signed)
Erroneous encounter

## 2021-07-04 NOTE — Telephone Encounter (Signed)
Billy Mann [phone] calling for patient was having a problem with bleeding @ surgical site,was recommended to re wrap foot w/ gauze and to call back with progress,said that the bleeding has not gone thru the new ace bandages, will hold tight for now and will not hesitate to  call back with any issues. ?

## 2021-07-04 NOTE — Telephone Encounter (Signed)
Patient's partner called to report that the patient had some bleeding through the bandage. Advised that the patient should come in to be evaluated. Patient partner stated that they did not have transportation to the office at this time. Recommended the patient to stay off of the surgical foot, elevate the site and use ice. Also advised patient to keep compression in place. Advised coming into the office or emergency room if bleeding continues. The partner verbalized understanding and called back to inform me that the bleeding has stopped and will callback with any other concerns.  ?

## 2021-07-08 ENCOUNTER — Encounter: Payer: 59 | Admitting: Family

## 2021-07-08 ENCOUNTER — Encounter: Payer: Self-pay | Admitting: Podiatry

## 2021-07-08 DIAGNOSIS — I10 Essential (primary) hypertension: Secondary | ICD-10-CM

## 2021-07-08 DIAGNOSIS — F900 Attention-deficit hyperactivity disorder, predominantly inattentive type: Secondary | ICD-10-CM

## 2021-07-08 DIAGNOSIS — F3181 Bipolar II disorder: Secondary | ICD-10-CM

## 2021-07-08 DIAGNOSIS — F32A Depression, unspecified: Secondary | ICD-10-CM

## 2021-07-08 DIAGNOSIS — F323 Major depressive disorder, single episode, severe with psychotic features: Secondary | ICD-10-CM

## 2021-07-09 ENCOUNTER — Ambulatory Visit (INDEPENDENT_AMBULATORY_CARE_PROVIDER_SITE_OTHER): Payer: 59

## 2021-07-09 ENCOUNTER — Telehealth: Payer: Self-pay | Admitting: *Deleted

## 2021-07-09 ENCOUNTER — Ambulatory Visit (INDEPENDENT_AMBULATORY_CARE_PROVIDER_SITE_OTHER): Payer: 59 | Admitting: Podiatry

## 2021-07-09 ENCOUNTER — Encounter: Payer: Self-pay | Admitting: Podiatry

## 2021-07-09 ENCOUNTER — Other Ambulatory Visit: Payer: Self-pay | Admitting: Nurse Practitioner

## 2021-07-09 ENCOUNTER — Other Ambulatory Visit: Payer: Self-pay

## 2021-07-09 DIAGNOSIS — M2041 Other hammer toe(s) (acquired), right foot: Secondary | ICD-10-CM

## 2021-07-09 DIAGNOSIS — Z9889 Other specified postprocedural states: Secondary | ICD-10-CM

## 2021-07-09 MED ORDER — HYDROCODONE-ACETAMINOPHEN 10-325 MG PO TABS: 1.0000 | ORAL_TABLET | ORAL | 0 refills | Status: DC | PRN

## 2021-07-09 MED ORDER — HYDROCODONE-ACETAMINOPHEN 5-325 MG PO TABS: 1.0000 | ORAL_TABLET | ORAL | 0 refills | Status: DC | PRN

## 2021-07-09 NOTE — Telephone Encounter (Signed)
Please advise 

## 2021-07-09 NOTE — Progress Notes (Signed)
? ?  Subjective:  ?Patient presents today status post bunionectomy with hammertoe repair right foot. DOS: 07/03/2021.  Patient states that he is doing well.  Pain is 4/10.  He denies any fever chills nausea vomiting shortness of breath or chest pain.  He has kept the dressings clean dry and intact.  He has been minimally weightbearing in the cam boot. ? ?Past Medical History:  ?Diagnosis Date  ? Anxiety   ? Phreesia 03/30/2020  ? Anxiety   ? Depression   ? [employer]reesia 03/30/2020  ? Depression   ? GERD (gastroesophageal reflux disease)   ? [employer]reesia 03/30/2020  ? High cholesterol   ? Hyperlipidemia   ? Phreesia 03/30/2020  ? Hypertension   ? Phreesia 03/30/2020  ? Sleep apnea   ? Phreesia 03/30/2020  ? ?  ? ?Objective/Physical Exam ?Neurovascular status intact.  Skin incisions appear to be well coapted with staples intact. No sign of infectious process noted. No dehiscence. No active bleeding noted. Moderate edema noted to the surgical extremity. ? ?Radiographic Exam:  ?Orthopedic hardware and osteotomies sites appear to be stable with routine healing. ? ?Assessment: ?1. s/p bunionectomy with hammertoe repair 2-5 RT. DOS: 07/03/2021 ? ? ?Plan of Care:  ?1. Patient was evaluated. X-rays reviewed ?2.  Dressings changed.  Clean dry and intact x1 week ?3.  Continue minimal weightbearing in the cam boot ?4.  Prescription for Vicodin 10/325 mg.  Patient is requesting a lesser medication from the oxycodone. ?5.  Return to clinic in 1 week ? ?*Boyfriend is Ellard Artis ? ? ?Edrick Kins, DPM ?[facility] ? ?Dr. Edrick Kins, DPM  ?  ?[address]                                    ?[place], [place] [address]                ?Office [phone]  ?Fax [phone] ? ? ? ? ? ?

## 2021-07-09 NOTE — Telephone Encounter (Signed)
Called patient, notified thru voice message of medication change.

## 2021-07-09 NOTE — Telephone Encounter (Signed)
Patient is calling to have his medication changed from hydrocodone 10/325 mg to hydrocodone 5/325 mg, so that his insurance will cover. Please advise. ?

## 2021-07-09 NOTE — Addendum Note (Signed)
Addended by: Edrick Kins on: 07/09/2021 01:59 PM ? ? Modules accepted: Orders ? ?

## 2021-07-09 NOTE — Telephone Encounter (Signed)
Prescription changed.  Please notify patient.  Thanks, Dr. Logan Bores

## 2021-07-10 ENCOUNTER — Ambulatory Visit: Payer: 59 | Admitting: Family

## 2021-07-16 ENCOUNTER — Ambulatory Visit (INDEPENDENT_AMBULATORY_CARE_PROVIDER_SITE_OTHER): Payer: 59 | Admitting: Podiatry

## 2021-07-16 ENCOUNTER — Other Ambulatory Visit: Payer: Self-pay

## 2021-07-16 DIAGNOSIS — Z9889 Other specified postprocedural states: Secondary | ICD-10-CM

## 2021-07-16 MED ORDER — OXYCODONE-ACETAMINOPHEN 5-325 MG PO TABS: 1.0000 | ORAL_TABLET | Freq: Four times a day (QID) | ORAL | 0 refills | Status: DC | PRN

## 2021-07-16 NOTE — Addendum Note (Signed)
Addended by: Felecia Shelling on: 07/16/2021 10:52 AM ? ? Modules accepted: Orders ? ?

## 2021-07-16 NOTE — Progress Notes (Signed)
? ?  Subjective:  ?Patient presents today status post bunionectomy with hammertoe repair right foot. DOS: 07/03/2021.  Patient is doing well.  He has been weightbearing in the cam boot as instructed.  No new complaints at this time ? ?Past Medical History:  ?Diagnosis Date  ? Anxiety   ? Phreesia 03/30/2020  ? Anxiety   ? Depression   ? Phreesia 03/30/2020  ? Depression   ? GERD (gastroesophageal reflux disease)   ? [employer]reesia 03/30/2020  ? High cholesterol   ? Hyperlipidemia   ? Phreesia 03/30/2020  ? Hypertension   ? Phreesia 03/30/2020  ? Sleep apnea   ? Phreesia 03/30/2020  ? ?  ? ?Objective/Physical Exam ?Neurovascular status intact.  Skin incisions appear to be well coapted with staples intact. No sign of infectious process noted. No dehiscence. No active bleeding noted. Moderate edema noted to the surgical extremity. ? ?Radiographic Exam:  ?Orthopedic hardware and osteotomies sites appear to be stable with routine healing. ? ?Assessment: ?1. s/p bunionectomy with hammertoe repair 2-5 RT. DOS: 07/03/2021 ? ? ?Plan of Care:  ?1. Patient was evaluated.  ?2.  Staples removed today ?3.  Patient may begin washing and showering getting the foot wet ?4.  Antibiotic ointment over the incision sites and percutaneous pins and recommend clean cotton sock ?5.  Continue minimal weightbearing in the cam boot ?6.  Return to clinic 2 weeks ? ?*Boyfriend is Clayburn Pert ? ? ?Felecia Shelling, DPM ?[facility] ? ?Dr. Felecia Shelling, DPM  ?  ?[address]                                    ?[place], [place] [address]                ?Office [phone]  ?Fax [phone] ? ? ? ? ? ?

## 2021-07-18 ENCOUNTER — Ambulatory Visit: Payer: 59 | Admitting: Family

## 2021-07-21 ENCOUNTER — Ambulatory Visit: Payer: 59 | Admitting: Podiatry

## 2021-07-21 ENCOUNTER — Other Ambulatory Visit: Payer: Self-pay

## 2021-07-21 ENCOUNTER — Ambulatory Visit (INDEPENDENT_AMBULATORY_CARE_PROVIDER_SITE_OTHER): Payer: 59

## 2021-07-21 DIAGNOSIS — Z9889 Other specified postprocedural states: Secondary | ICD-10-CM | POA: Diagnosis not present

## 2021-07-21 NOTE — Progress Notes (Signed)
? ?  Subjective:  ?Patient presents today status post bunionectomy with hammertoe repair right foot. DOS: 07/03/2021.  Patient noticed increased swelling and edema to the surgical foot.  He does state that he has had to be on his feet more than the prior surgery due to lack of help around the house.  He has also noticed increased swelling and edema to the foot. ? ?Past Medical History:  ?Diagnosis Date  ? Anxiety   ? Phreesia 03/30/2020  ? Anxiety   ? Depression   ? Phreesia 03/30/2020  ? Depression   ? GERD (gastroesophageal reflux disease)   ? Phreesia 03/30/2020  ? High cholesterol   ? Hyperlipidemia   ? Phreesia 03/30/2020  ? Hypertension   ? Phreesia 03/30/2020  ? Sleep apnea   ? Phreesia 03/30/2020  ? ?  ?Past Surgical History:  ?Procedure Laterality Date  ? FOOT SURGERY    ? bilateral hammer toe  ? TONSILLECTOMY    ? ?Allergies  ?Allergen Reactions  ? Penicillins Anaphylaxis  ? ? ? ?Objective/Physical Exam ?Neurovascular status intact.  Skin incisions healed.  Percutaneous fixation pins intact.  Heavy edema noted to the surgical forefoot.  No erythema. ? ?Radiographic Exam:  ?There is some movement of the capital fragment of the first metatarsal at the osteotomy site likely secondary to excessive walking on the foot ? ?Assessment: ?1. s/p bunionectomy with hammertoe repair 2-5 RT. DOS: 07/03/2021 ? ? ?Plan of Care:  ?1. Patient was evaluated.  ?2.  Notified patient of the movement of the capital fragment of the first metatarsal.  Stressed the importance of getting off of the foot.  Patient has crutches at home.  Recommend nonweightbearing is much as possible and absolute minimal weightbearing as needed. ?3.  Recommend Ace wrap daily.  Elevation and ice ?4.  Continue cam boot ?5.  Return to clinic 2 weeks for percutaneous pin removal ? ?*Boyfriend is Clayburn Pert ? ? ?Felecia Shelling, DPM ?[facility] ? ?Dr. Felecia Shelling, DPM  ?  ?[address]                                    ?[place], [place] [address]                 ?Office [phone]  ?Fax [phone] ? ? ? ? ? ?

## 2021-07-22 ENCOUNTER — Ambulatory Visit: Payer: 59 | Admitting: Nurse Practitioner

## 2021-07-29 ENCOUNTER — Other Ambulatory Visit: Payer: Self-pay | Admitting: Nurse Practitioner

## 2021-07-29 DIAGNOSIS — N529 Male erectile dysfunction, unspecified: Secondary | ICD-10-CM

## 2021-07-30 ENCOUNTER — Other Ambulatory Visit: Payer: 59

## 2021-07-30 ENCOUNTER — Ambulatory Visit: Payer: 59 | Admitting: Pharmacist

## 2021-07-30 ENCOUNTER — Encounter: Payer: 59 | Admitting: Podiatry

## 2021-07-30 ENCOUNTER — Other Ambulatory Visit: Payer: Self-pay | Admitting: Podiatry

## 2021-08-04 ENCOUNTER — Ambulatory Visit (INDEPENDENT_AMBULATORY_CARE_PROVIDER_SITE_OTHER): Payer: 59 | Admitting: Podiatry

## 2021-08-04 DIAGNOSIS — Z9889 Other specified postprocedural states: Secondary | ICD-10-CM

## 2021-08-04 MED ORDER — OXYCODONE-ACETAMINOPHEN 5-325 MG PO TABS: 1.0000 | ORAL_TABLET | Freq: Three times a day (TID) | ORAL | 0 refills | Status: DC | PRN

## 2021-08-04 NOTE — Telephone Encounter (Signed)
Please advise 

## 2021-08-04 NOTE — Progress Notes (Signed)
? ?  Subjective:  ?Patient presents today status post bunionectomy with hammertoe repair right foot. DOS: 07/03/2021.  Overall the patient is doing much better.  He says that he has been off of his foot significantly with significant improvement.  He presents for further treatment and evaluation ? ?Past Medical History:  ?Diagnosis Date  ? Anxiety   ? Phreesia 03/30/2020  ? Anxiety   ? Depression   ? Phreesia 03/30/2020  ? Depression   ? GERD (gastroesophageal reflux disease)   ? Phreesia 03/30/2020  ? High cholesterol   ? Hyperlipidemia   ? Phreesia 03/30/2020  ? Hypertension   ? Phreesia 03/30/2020  ? Sleep apnea   ? Phreesia 03/30/2020  ? ?  ?Past Surgical History:  ?Procedure Laterality Date  ? FOOT SURGERY    ? bilateral hammer toe  ? TONSILLECTOMY    ? ?Allergies  ?Allergen Reactions  ? Penicillins Anaphylaxis  ? ? ? ?Objective/Physical Exam ?Neurovascular status intact.  Skin incisions healed.  Percutaneous fixation pins intact.  Significantly improved edema noted to the surgical forefoot.  No erythema.  No clinical evidence of infection ? ?Assessment: ?1. s/p bunionectomy with hammertoe repair 2-5 RT. DOS: 07/03/2021 ? ? ?Plan of Care:  ?1. Patient was evaluated.  Percutaneous pins removed today ?3.  Patient may slowly begin to transition out of the cam boot into good supportive sneakers beginning in 2 weeks.  In the meantime continue the cam boot ?4.  Return to clinic 4 weeks for follow-up x-ray ? ? ?*Boyfriend is Clayburn Pert ? ? ?Felecia Shelling, DPM ?[facility] ? ?Dr. Felecia Shelling, DPM  ?  ?[address]                                    ?[place], [place] [address]                ?Office [phone]  ?Fax [phone] ? ? ? ? ? ?

## 2021-08-07 NOTE — Telephone Encounter (Signed)
Requested medication (s) are due for refill today: 4/31/23 ? ?Requested medication (s) are on the active medication list: yes ? ?Last refill:  05/20/21 #6  5 refills ? ?Future visit scheduled: yes in 1 week ? ?Notes to clinic:  last ordered by T. Tanda Rockers, NP. Do you want to refill Rx? ? ? ?  ?Requested Prescriptions  ?Pending Prescriptions Disp Refills  ? sildenafil (VIAGRA) 100 MG tablet [Pharmacy Med Name: Sildenafil Citrate 100 MG Oral Tablet] 6 tablet 0  ?  Sig: TAKE 1/2 TO 1 (ONE-HALF TO ONE) TABLET BY MOUTH ONCE DAILY AS NEEDED FOR ERECTILE DYSFUNCTION  ?  ? Urology: Erectile Dysfunction Agents Failed - 08/07/2021  3:28 PM  ?  ?  Failed - Valid encounter within last 12 months  ?  Recent Outpatient Visits   ? ?      ? 2 months ago Anxiety and depression  ? Primary Care at [facility], Gildardo Pounds, NP  ? 1 year ago Bilateral carpal tunnel syndrome  ? Primary Care at [facility], Kandee Keen, MD  ? 1 year ago Gastroesophageal reflux disease, unspecified whether esophagitis present  ? Primary Care at [facility], Kandee Keen, MD  ? 1 year ago Encounter to establish care  ? Primary Care at [facility], Kandee Keen, MD  ? ?  ?  ?Future Appointments   ? ?        ? In 1 week Georganna Skeans, MD Primary Care at [facility]  ? ?  ? ? ?  ?  ?  Passed - AST in normal range and within 360 days  ?  AST  ?Date Value Ref Range Status  ?01/31/2021 24 0 - 40 IU/L Final  ?  ?  ?  ?  Passed - ALT in normal range and within 360 days  ?  ALT  ?Date Value Ref Range Status  ?01/31/2021 28 0 - 44 IU/L Final  ?  ?  ?  ?  Passed - Last BP in normal range  ?  BP Readings from Last 1 Encounters:  ?02/24/21 128/82  ?  ?  ?  ?  ? ?

## 2021-08-07 NOTE — Telephone Encounter (Signed)
Pt requesting an update on refill for medication sildenafil (VIAGRA) 100 MG tablet. ?Pt is completely out of medication. Pt mentioned pharmacy is only filling 6 tablets at a time.  ? ?[employer] [address] [place], [place] - [address]  ?[address] [place] [address]  ?Phone: [phone] Fax: [phone]  ?Hours: Not open 24 hours  ? ?

## 2021-08-11 ENCOUNTER — Telehealth (HOSPITAL_COMMUNITY): Payer: 59 | Admitting: Psychiatry

## 2021-08-12 ENCOUNTER — Telehealth (HOSPITAL_COMMUNITY): Payer: No Payment, Other | Admitting: Psychiatry

## 2021-08-12 ENCOUNTER — Encounter (HOSPITAL_COMMUNITY): Payer: Self-pay

## 2021-08-14 ENCOUNTER — Encounter: Payer: Self-pay | Admitting: Family Medicine

## 2021-08-14 ENCOUNTER — Ambulatory Visit (INDEPENDENT_AMBULATORY_CARE_PROVIDER_SITE_OTHER): Payer: 59 | Admitting: Family Medicine

## 2021-08-14 VITALS — BP 146/90 | HR 82 | Temp 98.3°F | Resp 18 | Wt 185.0 lb

## 2021-08-14 DIAGNOSIS — E7849 Other hyperlipidemia: Secondary | ICD-10-CM | POA: Diagnosis not present

## 2021-08-14 DIAGNOSIS — F419 Anxiety disorder, unspecified: Secondary | ICD-10-CM

## 2021-08-14 DIAGNOSIS — F32A Depression, unspecified: Secondary | ICD-10-CM

## 2021-08-14 DIAGNOSIS — L729 Follicular cyst of the skin and subcutaneous tissue, unspecified: Secondary | ICD-10-CM | POA: Diagnosis not present

## 2021-08-14 DIAGNOSIS — I10 Essential (primary) hypertension: Secondary | ICD-10-CM | POA: Diagnosis not present

## 2021-08-14 DIAGNOSIS — N529 Male erectile dysfunction, unspecified: Secondary | ICD-10-CM

## 2021-08-14 MED ORDER — SILDENAFIL CITRATE 100 MG PO TABS: 50.0000 mg | ORAL_TABLET | Freq: Every day | ORAL | 5 refills | Status: DC | PRN

## 2021-08-14 MED ORDER — ATORVASTATIN CALCIUM 20 MG PO TABS: 20.0000 mg | ORAL_TABLET | Freq: Every day | ORAL | 1 refills | Status: DC

## 2021-08-14 MED ORDER — HYDROCHLOROTHIAZIDE 25 MG PO TABS: 25.0000 mg | ORAL_TABLET | Freq: Every day | ORAL | 1 refills | Status: DC

## 2021-08-14 NOTE — Progress Notes (Signed)
Patient is here for c/o all of his depression medication not working. Patient said that his ADHD has been acting up very bad.  ? ?Patient c/o having cycst bumps in different area of his body and would to get a referral for a dermatologist.  ?

## 2021-08-15 ENCOUNTER — Other Ambulatory Visit (HOSPITAL_COMMUNITY): Payer: Self-pay | Admitting: Psychiatry

## 2021-08-15 ENCOUNTER — Other Ambulatory Visit: Payer: Self-pay | Admitting: Podiatry

## 2021-08-15 ENCOUNTER — Encounter: Payer: Self-pay | Admitting: Family Medicine

## 2021-08-15 DIAGNOSIS — F17200 Nicotine dependence, unspecified, uncomplicated: Secondary | ICD-10-CM

## 2021-08-15 DIAGNOSIS — F900 Attention-deficit hyperactivity disorder, predominantly inattentive type: Secondary | ICD-10-CM

## 2021-08-15 NOTE — Progress Notes (Signed)
? ?New Patient Office Visit ? ?Subjective   ? ?Patient ID: Billy Mann, male    DOB: 1981/01/23  Age: 41 y.o. MRN: [id number] ? ?CC:  ?Chief Complaint  ?Patient presents with  ? Follow-up  ? Hypertension  ? Anxiety  ? ? ?HPI ?Billy Mann presents to establish care and for review of chronic med issues including hypertension and ED. He also is awaiting an appt with [place] for meds for depression and ADHD.  ? ? ?Outpatient Encounter Medications as of 08/14/2021  ?Medication Sig  ? buPROPion (WELLBUTRIN XL) 150 MG 24 hr tablet Take 1 tablet (150 mg total) by mouth every morning.  ? busPIRone (BUSPAR) 15 MG tablet Take 1 tablet (15 mg total) by mouth 3 (three) times daily.  ? diclofenac (VOLTAREN) 75 MG EC tablet Take 1 tablet (75 mg total) by mouth 2 (two) times daily.  ? famotidine (PEPCID) 40 MG tablet Take 1 tablet (40 mg total) by mouth daily. APPOINTMENT NEED FOR REFILLS  ? hydrochlorothiazide (HYDRODIURIL) 12.5 MG tablet Take 1 tablet (12.5 mg total) by mouth daily.  ? hydrochlorothiazide (HYDRODIURIL) 25 MG tablet Take 1 tablet (25 mg total) by mouth daily.  ? lamoTRIgine (LAMICTAL) 25 MG tablet Take 1 tablet (25 mg total) by mouth daily.  ? oxyCODONE-acetaminophen (PERCOCET) 5-325 MG tablet Take 1 tablet by mouth every 8 (eight) hours as needed for severe pain.  ? pantoprazole (PROTONIX) 40 MG tablet Take 1 tablet by mouth once daily  ? QUEtiapine (SEROQUEL) 50 MG tablet Take 1 tablet (50 mg total) by mouth at bedtime.  ? [DISCONTINUED] atorvastatin (LIPITOR) 20 MG tablet Take 1 tablet (20 mg total) by mouth daily.  ? [DISCONTINUED] sildenafil (VIAGRA) 100 MG tablet Take 0.5-1 tablets (50-100 mg total) by mouth daily as needed for erectile dysfunction.  ? atorvastatin (LIPITOR) 20 MG tablet Take 1 tablet (20 mg total) by mouth daily.  ? sildenafil (VIAGRA) 100 MG tablet Take 0.5-1 tablets (50-100 mg total) by mouth daily as needed for erectile dysfunction.  ? ?No facility-administered encounter medications on  file as of 08/14/2021.  ? ? ?Past Medical History:  ?Diagnosis Date  ? Anxiety   ? Phreesia 03/30/2020  ? Anxiety   ? Depression   ? Phreesia 03/30/2020  ? Depression   ? GERD (gastroesophageal reflux disease)   ? Phreesia 03/30/2020  ? High cholesterol   ? Hyperlipidemia   ? Phreesia 03/30/2020  ? Hypertension   ? Phreesia 03/30/2020  ? Sleep apnea   ? Phreesia 03/30/2020  ? ? ?Past Surgical History:  ?Procedure Laterality Date  ? FOOT SURGERY    ? bilateral hammer toe  ? TONSILLECTOMY    ? ? ?Family History  ?Problem Relation Age of Onset  ? Heart disease Mother   ? Heart disease Father   ? Diabetes Paternal Grandmother   ? ? ?Social History  ? ?Socioeconomic History  ? Marital status: Soil scientist  ?  Spouse name: Not on file  ? Number of children: Not on file  ? Years of education: Not on file  ? Highest education level: Not on file  ?Occupational History  ? Occupation: bartender  ?Tobacco Use  ? Smoking status: Every Day  ?  Types: Cigarettes  ? Smokeless tobacco: Never  ?Vaping Use  ? Vaping Use: Never used  ?Substance and Sexual Activity  ? Alcohol use: Never  ? Drug use: Yes  ?  Types: Methamphetamines  ? Sexual activity: Yes  ?Other Topics Concern  ?  Not on file  ?Social History Narrative  ? Not on file  ? ?Social Determinants of Health  ? ?Financial Resource Strain: Not on file  ?Food Insecurity: Not on file  ?Transportation Needs: Not on file  ?Physical Activity: Not on file  ?Stress: Not on file  ?Social Connections: Not on file  ?Intimate Partner Violence: Not on file  ? ? ?Review of Systems  ?Psychiatric/Behavioral:  Positive for depression. Negative for substance abuse and suicidal ideas. The patient is nervous/anxious.   ?All other systems reviewed and are negative. ? ?  ? ? ?Objective   ? ?BP (!) 146/90   Pulse 82   Temp 98.3 ?F (36.8 ?C) (Oral)   Resp 18   Wt 185 lb (83.9 kg)   SpO2 98%   BMI 26.54 kg/m?  ? ?Physical Exam ?Vitals and nursing note reviewed.  ?Constitutional:   ?   General:  He is not in acute distress. ?Cardiovascular:  ?   Rate and Rhythm: Normal rate and regular rhythm.  ?Pulmonary:  ?   Effort: Pulmonary effort is normal.  ?   Breath sounds: Normal breath sounds.  ?Abdominal:  ?   Palpations: Abdomen is soft.  ?   Tenderness: There is no abdominal tenderness.  ?Skin: ?   Comments: Solitary cystic lesions noted on right neck and left lateral face.   ?Neurological:  ?   General: No focal deficit present.  ?   Mental Status: He is alert and oriented to person, place, and time.  ?Psychiatric:     ?   Attention and Perception: Attention normal.     ?   Mood and Affect: Mood is anxious and elated.     ?   Speech: Speech is rapid and pressured.     ?   Behavior: Behavior normal. Behavior is cooperative.  ? ? ? ?  ? ?Assessment & Plan:  ? ?1. Primary hypertension ?Slightly elevated reading. Will increase HCTZ from 12.5 to 25 mg daily and monitor ? ?2. Other hyperlipidemia ?Continue present management. Meds refilled.  ? ?3. Erectile dysfunction, unspecified erectile dysfunction type ?Meds refilled ?- sildenafil (VIAGRA) 100 MG tablet; Take 0.5-1 tablets (50-100 mg total) by mouth daily as needed for erectile dysfunction.  Dispense: 6 tablet; Refill: 5 ? ?4. Cyst of skin ?Referral to derm for further eval/mgt ?- Ambulatory referral to Dermatology ? ?5. Anxiety and depression ?Patient awaiting [facility] consult for further eval/mgt ? ? ? ?No follow-ups on file.  ? ?Becky Sax, MD ? ? ?

## 2021-08-18 ENCOUNTER — Ambulatory Visit: Payer: 59 | Admitting: Family Medicine

## 2021-08-18 ENCOUNTER — Other Ambulatory Visit: Payer: Self-pay | Admitting: Podiatry

## 2021-08-18 MED ORDER — OXYCODONE-ACETAMINOPHEN 5-325 MG PO TABS: 1.0000 | ORAL_TABLET | Freq: Three times a day (TID) | ORAL | 0 refills | Status: DC | PRN

## 2021-08-21 ENCOUNTER — Telehealth: Payer: Self-pay | Admitting: Physician Assistant

## 2021-08-21 NOTE — Telephone Encounter (Signed)
Referral attached to appointment

## 2021-08-21 NOTE — Telephone Encounter (Signed)
Patient is calling for a referral appointment from Georganna Skeans, M.D.  Patient is schedule for 04/22/2022 at 8:30 with [facility], PA-C. ?

## 2021-08-27 ENCOUNTER — Telehealth (INDEPENDENT_AMBULATORY_CARE_PROVIDER_SITE_OTHER): Payer: 59 | Admitting: Psychiatry

## 2021-08-27 ENCOUNTER — Telehealth: Payer: 59 | Admitting: Physician Assistant

## 2021-08-27 DIAGNOSIS — K529 Noninfective gastroenteritis and colitis, unspecified: Secondary | ICD-10-CM | POA: Diagnosis not present

## 2021-08-27 DIAGNOSIS — F17200 Nicotine dependence, unspecified, uncomplicated: Secondary | ICD-10-CM

## 2021-08-27 DIAGNOSIS — F3181 Bipolar II disorder: Secondary | ICD-10-CM | POA: Diagnosis not present

## 2021-08-27 DIAGNOSIS — F411 Generalized anxiety disorder: Secondary | ICD-10-CM

## 2021-08-27 DIAGNOSIS — F900 Attention-deficit hyperactivity disorder, predominantly inattentive type: Secondary | ICD-10-CM | POA: Diagnosis not present

## 2021-08-27 MED ORDER — BUPROPION HCL ER (XL) 150 MG PO TB24: 150.0000 mg | ORAL_TABLET | ORAL | 1 refills | Status: DC

## 2021-08-27 MED ORDER — LAMOTRIGINE 25 MG PO TABS: 25.0000 mg | ORAL_TABLET | Freq: Every day | ORAL | 1 refills | Status: DC

## 2021-08-27 MED ORDER — BUSPIRONE HCL 15 MG PO TABS: 15.0000 mg | ORAL_TABLET | Freq: Three times a day (TID) | ORAL | 1 refills | Status: DC

## 2021-08-27 MED ORDER — ONDANSETRON HCL 4 MG PO TABS: 4.0000 mg | ORAL_TABLET | Freq: Three times a day (TID) | ORAL | 0 refills | Status: DC | PRN

## 2021-08-27 MED ORDER — QUETIAPINE FUMARATE 50 MG PO TABS
50.0000 mg | ORAL_TABLET | Freq: Every day | ORAL | 1 refills | Status: DC
Start: 2021-08-27 — End: 2021-12-10

## 2021-08-27 NOTE — Progress Notes (Signed)
We are sorry that you are not feeling well.  Here is how we plan to help!  Based on what you have shared with me it looks like you have Acute Infectious Diarrhea.  Most cases of acute diarrhea are due to infections with virus and bacteria and are self-limited conditions lasting less than 14 days.  For your symptoms you may take Imodium 2 mg tablets that are over the counter at your local pharmacy. Take two tablet now and then one after each loose stool up to 6 a day.  Antibiotics are not needed for most people with diarrhea.  Optional: Zofran 4 mg 1 tablet every 8 hours as needed for nausea and vomiting   HOME CARE We recommend changing your diet to help with your symptoms for the next few days. Drink plenty of fluids that contain water salt and sugar. Sports drinks such as Gatorade may help.  You may try broths, soups, bananas, applesauce, soft breads, mashed potatoes or crackers.  You are considered infectious for as long as the diarrhea continues. Hand washing or use of alcohol based hand sanitizers is recommend. It is best to stay out of work or school until your symptoms stop.   GET HELP RIGHT AWAY If you have dark yellow colored urine or do not pass urine frequently you should drink more fluids.   If your symptoms worsen  If you feel like you are going to pass out (faint) You have a new problem  MAKE SURE YOU  Understand these instructions. Will watch your condition. Will get help right away if you are not doing well or get worse.  Thank you for choosing an e-visit.  Your e-visit answers were reviewed by a board certified advanced clinical practitioner to complete your personal care plan. Depending upon the condition, your plan could have included both over the counter or prescription medications.  Please review your pharmacy choice. Make sure the pharmacy is open so you can pick up prescription now. If there is a problem, you may contact your provider through MyChart  messaging and have the prescription routed to another pharmacy.  Your safety is important to us. If you have drug allergies check your prescription carefully.   For the next 24 hours you can use MyChart to ask questions about today's visit, request a non-urgent call back, or ask for a work or school excuse. You will get an email in the next two days asking about your experience. I hope that your e-visit has been valuable and will speed your recovery.  I provided 5 minutes of non face-to-face time during this encounter for chart review and documentation.    

## 2021-08-27 NOTE — Progress Notes (Signed)
BH MD/PA/NP OP Progress Note ? ?08/27/2021 4:02 PM ?Billy GayJeffrey Mcphearson  ?MRN:  [id number] ? ?Virtual Visit via Telephone Note ? ?I connected with Billy GayJeffrey Knippenberg on 08/27/21 at  2:00 PM EDT by telephone and verified that I am speaking with the correct person using two identifiers. ? ?Location: ?Patient: home ?Provider: offsite ?  ?I discussed the limitations, risks, security and privacy concerns of performing an evaluation and management service by telephone and the availability of in person appointments. I also discussed with the patient that there may be a patient responsible charge related to this service. The patient expressed understanding and agreed to proceed. ? ? ? ?  ?I discussed the assessment and treatment plan with the patient. The patient was provided an opportunity to ask questions and all were answered. The patient agreed with the plan and demonstrated an understanding of the instructions. ?  ?The patient was advised to call back or seek an in-person evaluation if the symptoms worsen or if the condition fails to improve as anticipated. ? ?I provided 10 minutes of non-face-to-face time during this encounter. ? ? ?Mcneil Sobericely Brydon Spahr, NP  ? ? ? ? ?Chief Complaint: Medication management ? ?HPI: Billy Mann is a 41 year old [facility]-up psychiatric evaluation.  Patient is visiting with this provider as his attending psychiatric provider is unavailable to complete today's appointment.  He has a psychiatric history of bipolar disorder, major depression, generalized anxiety disorder, obsessive compulsive personality disorder and ADHD.  His symptoms are managed with Seroquel 50 mg daily at bedtime, Lamictal 25 mg daily, BuSpar 15 mg 3 times daily and Wellbutrin 150 mg daily.  Patient reports that medications are effective with managing his symptoms and that he is medication compliant.  Patient denies adverse medication reactions or the need for dosage  adjustment today.  No medications changes today. ? ? ?Visit Diagnosis:  ?  ICD-10-CM   ?1. Attention deficit hyperactivity disorder (ADHD), predominantly inattentive type  F90.0 buPROPion (WELLBUTRIN XL) 150 MG 24 hr tablet  ?  ?2. Tobacco dependence  F17.200 buPROPion (WELLBUTRIN XL) 150 MG 24 hr tablet  ?  ?3. Generalized anxiety disorder  F41.1 busPIRone (BUSPAR) 15 MG tablet  ?  ?4. Bipolar 2 disorder (HCC)  F31.81 lamoTRIgine (LAMICTAL) 25 MG tablet  ?  QUEtiapine (SEROQUEL) 50 MG tablet  ?  ? ? ?Past Psychiatric History: bipolar disorder, major depression, generalized anxiety disorder, obsessive compulsive personality disorder and ADHD. ? ?Past Medical History:  ?Past Medical History:  ?Diagnosis Date  ? Anxiety   ? Phreesia 03/30/2020  ? Anxiety   ? Depression   ? Phreesia 03/30/2020  ? Depression   ? GERD (gastroesophageal reflux disease)   ? Phreesia 03/30/2020  ? High cholesterol   ? Hyperlipidemia   ? Phreesia 03/30/2020  ? Hypertension   ? Phreesia 03/30/2020  ? Sleep apnea   ? Phreesia 03/30/2020  ?  ?Past Surgical History:  ?Procedure Laterality Date  ? FOOT SURGERY    ? bilateral hammer toe  ? TONSILLECTOMY    ? ? ?Family Psychiatric History: N/A ? ?Family History:  ?Family History  ?Problem Relation Age of Onset  ? Heart disease Mother   ? Heart disease Father   ? Diabetes Paternal Grandmother   ? ? ?Social History:  ?Social History  ? ?Socioeconomic History  ? Marital status: Media plannerDomestic Partner  ?  Spouse name: Not on file  ? Number of children: Not on file  ? Years  of education: Not on file  ? Highest education level: Not on file  ?Occupational History  ? Occupation: bartender  ?Tobacco Use  ? Smoking status: Every Day  ?  Types: Cigarettes  ? Smokeless tobacco: Never  ?Vaping Use  ? Vaping Use: Never used  ?Substance and Sexual Activity  ? Alcohol use: Never  ? Drug use: Yes  ?  Types: Methamphetamines  ? Sexual activity: Yes  ?Other Topics Concern  ? Not on file  ?Social History Narrative  ? Not on  file  ? ?Social Determinants of Health  ? ?Financial Resource Strain: Not on file  ?Food Insecurity: Not on file  ?Transportation Needs: Not on file  ?Physical Activity: Not on file  ?Stress: Not on file  ?Social Connections: Not on file  ? ? ?Allergies:  ?Allergies  ?Allergen Reactions  ? Penicillins Anaphylaxis  ? ? ?Metabolic Disorder Labs: ?Lab Results  ?Component Value Date  ? HGBA1C 4.8 02/17/2021  ? MPG 91 02/17/2021  ? ?No results found for: PROLACTIN ?Lab Results  ?Component Value Date  ? CHOL 206 (H) 02/17/2021  ? TRIG 78 02/17/2021  ? HDL 55 02/17/2021  ? CHOLHDL 3.7 02/17/2021  ? LDLCALC 134 (H) 02/17/2021  ? LDLCALC 160 (H) 04/04/2020  ? ?No results found for: TSH ? ?Therapeutic Level Labs: ?No results found for: LITHIUM ?No results found for: VALPROATE ?No components found for:  CBMZ ? ?Current Medications: ?Current Outpatient Medications  ?Medication Sig Dispense Refill  ? atorvastatin (LIPITOR) 20 MG tablet Take 1 tablet (20 mg total) by mouth daily. 90 tablet 1  ? buPROPion (WELLBUTRIN XL) 150 MG 24 hr tablet Take 1 tablet (150 mg total) by mouth every morning. 30 tablet 1  ? busPIRone (BUSPAR) 15 MG tablet Take 1 tablet (15 mg total) by mouth 3 (three) times daily. 90 tablet 1  ? diclofenac (VOLTAREN) 75 MG EC tablet Take 1 tablet (75 mg total) by mouth 2 (two) times daily. 60 tablet 1  ? famotidine (PEPCID) 40 MG tablet Take 1 tablet (40 mg total) by mouth daily. APPOINTMENT NEED FOR REFILLS 30 tablet 0  ? hydrochlorothiazide (HYDRODIURIL) 12.5 MG tablet Take 1 tablet (12.5 mg total) by mouth daily. 30 tablet 1  ? hydrochlorothiazide (HYDRODIURIL) 25 MG tablet Take 1 tablet (25 mg total) by mouth daily. 90 tablet 1  ? lamoTRIgine (LAMICTAL) 25 MG tablet Take 1 tablet (25 mg total) by mouth daily. 30 tablet 1  ? ondansetron (ZOFRAN) 4 MG tablet Take 1 tablet (4 mg total) by mouth every 8 (eight) hours as needed for nausea or vomiting. 20 tablet 0  ? oxyCODONE-acetaminophen (PERCOCET) 5-325 MG  tablet Take 1 tablet by mouth every 8 (eight) hours as needed for severe pain. 20 tablet 0  ? pantoprazole (PROTONIX) 40 MG tablet Take 1 tablet by mouth once daily 90 tablet 3  ? QUEtiapine (SEROQUEL) 50 MG tablet Take 1 tablet (50 mg total) by mouth at bedtime. 30 tablet 1  ? sildenafil (VIAGRA) 100 MG tablet Take 0.5-1 tablets (50-100 mg total) by mouth daily as needed for erectile dysfunction. 6 tablet 5  ? ?No current facility-administered medications for this visit.  ? ? ? ?Musculoskeletal: ?Strength & Muscle Tone: N/A virtual visit ?Gait & Station: N/A ?Patient leans: N/A ? ?Psychiatric Specialty Exam: ?Review of Systems  ?Psychiatric/Behavioral:  Negative for hallucinations, self-injury and suicidal ideas.   ?All other systems reviewed and are negative.  ?There were no vitals taken for this visit.There is no  height or weight on file to calculate BMI.  ?General Appearance: NA  ?Eye Contact:  NA  ?Speech:  Clear and Coherent  ?Volume:  Normal  ?Mood:  Euthymic  ?Affect:  NA  ?Thought Process:  Goal Directed  ?Orientation:  Full (Time, Place, and Person)  ?Thought Content: Logical   ?Suicidal Thoughts:  No  ?Homicidal Thoughts:  No  ?Memory: Good  ?Judgement:  Good  ?Insight:  Good  ?Psychomotor Activity:  NA  ?Concentration: Good  ?Recall: Good  ?Fund of Knowledge: Good  ?Language: Good  ?Akathisia: Denies  ?Handed:  Right  ?AIMS (if indicated): not done  ?Assets:  Communication Skills ?Desire for Improvement  ?ADL's:  Intact  ?Cognition: WNL  ?Sleep:  Good  ? ?Screenings: ?GAD-7   ? ?Flowsheet Row Video Visit from 05/26/2021 in [facility] Office Visit from 05/30/2020 in Primary Care at [facility] Video Visit from 05/20/2020 in [facility] Video Visit from 02/19/2020 in [facility]  ?Total GAD-7 Score 15 13 21 18   ? ?  ? ?PHQ2-9   ? ?Flowsheet Row Office Visit from 08/14/2021 in Primary Care at [facility] Video Visit from  05/26/2021 in [facility] Office Visit from 05/30/2020 in Primary Care at [facility] from 05/21/2020 in Primary Care at [facility] Video Visit from 05/20/2020 in

## 2021-08-30 ENCOUNTER — Telehealth: Payer: 59 | Admitting: Nurse Practitioner

## 2021-08-30 DIAGNOSIS — B9689 Other specified bacterial agents as the cause of diseases classified elsewhere: Secondary | ICD-10-CM

## 2021-08-30 DIAGNOSIS — J329 Chronic sinusitis, unspecified: Secondary | ICD-10-CM | POA: Diagnosis not present

## 2021-08-30 MED ORDER — DOXYCYCLINE HYCLATE 100 MG PO TABS: 100.0000 mg | ORAL_TABLET | Freq: Two times a day (BID) | ORAL | 0 refills | Status: AC

## 2021-08-30 NOTE — Progress Notes (Signed)
?Virtual Visit Consent  ? ?Billy Mann, you are scheduled for a virtual visit with a [facility] provider today. Just as with appointments in the office, your consent must be obtained to participate. Your consent will be active for this visit and any virtual visit you may have with one of our providers in the next 365 days. If you have a MyChart account, a copy of this consent can be sent to you electronically. ? ?As this is a virtual visit, video technology does not allow for your provider to perform a traditional examination. This may limit your provider's ability to fully assess your condition. If your provider identifies any concerns that need to be evaluated in person or the need to arrange testing (such as labs, EKG, etc.), we will make arrangements to do so. Although advances in technology are sophisticated, we cannot ensure that it will always work on either your end or our end. If the connection with a video visit is poor, the visit may have to be switched to a telephone visit. With either a video or telephone visit, we are not always able to ensure that we have a secure connection. ? ?By engaging in this virtual visit, you consent to the provision of healthcare and authorize for your insurance to be billed (if applicable) for the services provided during this visit. Depending on your insurance coverage, you may receive a charge related to this service. ? ?I need to obtain your verbal consent now. Are you willing to proceed with your visit today? Billy Mann has provided verbal consent on 08/30/2021 for a virtual visit (video or telephone). Claiborne Rigg, NP ? ?Date: 08/30/2021 12:22 PM ? ?Virtual Visit via Video Note  ? ?IClaiborne Rigg, connected with  Billy Mann  ([id number], 07/29/1980) on 08/30/21 at 12:00 PM EDT by a video-enabled telemedicine application and verified that I am speaking with the correct person using two identifiers. ? ?Location: ?Patient: Virtual Visit Location Patient:  Home ?Provider: Virtual Visit Location Provider: Home Office ?  ?I discussed the limitations of evaluation and management by telemedicine and the availability of in person appointments. The patient expressed understanding and agreed to proceed.   ? ?History of Present Illness: ?Billy Mann is a 41 y.o. who identifies as a male who was assigned male at birth, and is being seen today for ACUTE  bacterial URI symptoms. ? ?Patient complains of congestion, fever 100.9, nasal congestion, post nasal drip, purulent nasal discharge, sinus pressure, and sore throat.  Onset of symptoms was several days ago, gradually worsening since that time.   Patient is a smoker.   ? ?He did take a COVID test today and it was negative. ?Problems:  ?Patient Active Problem List  ? Diagnosis Date Noted  ? Bipolar 2 disorder (HCC) 05/26/2021  ? History of Clostridioides difficile colitis 02/24/2021  ? Hypertension 02/17/2021  ? 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 On pre-exposure prophylaxis for HIV 02/06/2021  ? GERD (gastroesophageal reflux disease) 06/10/2020  ? Diarrhea 06/10/2020  ? Mixed hyperlipidemia 04/09/2020  ? 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 Generalized anxiety disorder 02/19/2020  ? 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  ? 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 Diabetes mellitus type 2, controlled (HCC) 05/18/2018  ?  ?Allergies:  ?Allergies  ?Allergen Reactions  ? Penicillins Anaphylaxis  ? ?Medications:  ?Current Outpatient Medications:  ?  doxycycline (VIBRA-TABS) 100 MG tablet, Take 1 tablet (100 mg total) by mouth 2 (  two) times daily for 10 days., Disp: 20 tablet, Rfl: 0 ?  atorvastatin (LIPITOR) 20 MG tablet, Take 1 tablet (20 mg total) by mouth daily., Disp: 90 tablet, Rfl: 1 ?  buPROPion (WELLBUTRIN XL) 150 MG 24 hr tablet, Take 1 tablet (150 mg total) by mouth every morning., Disp: 30 tablet, Rfl: 1 ?  busPIRone  (BUSPAR) 15 MG tablet, Take 1 tablet (15 mg total) by mouth 3 (three) times daily., Disp: 90 tablet, Rfl: 1 ?  diclofenac (VOLTAREN) 75 MG EC tablet, Take 1 tablet (75 mg total) by mouth 2 (two) times daily., Disp: 60 tablet, Rfl: 1 ?  famotidine (PEPCID) 40 MG tablet, Take 1 tablet (40 mg total) by mouth daily. APPOINTMENT NEED FOR REFILLS, Disp: 30 tablet, Rfl: 0 ?  hydrochlorothiazide (HYDRODIURIL) 12.5 MG tablet, Take 1 tablet (12.5 mg total) by mouth daily., Disp: 30 tablet, Rfl: 1 ?  hydrochlorothiazide (HYDRODIURIL) 25 MG tablet, Take 1 tablet (25 mg total) by mouth daily., Disp: 90 tablet, Rfl: 1 ?  lamoTRIgine (LAMICTAL) 25 MG tablet, Take 1 tablet (25 mg total) by mouth daily., Disp: 30 tablet, Rfl: 1 ?  ondansetron (ZOFRAN) 4 MG tablet, Take 1 tablet (4 mg total) by mouth every 8 (eight) hours as needed for nausea or vomiting., Disp: 20 tablet, Rfl: 0 ?  oxyCODONE-acetaminophen (PERCOCET) 5-325 MG tablet, Take 1 tablet by mouth every 8 (eight) hours as needed for severe pain., Disp: 20 tablet, Rfl: 0 ?  pantoprazole (PROTONIX) 40 MG tablet, Take 1 tablet by mouth once daily, Disp: 90 tablet, Rfl: 3 ?  QUEtiapine (SEROQUEL) 50 MG tablet, Take 1 tablet (50 mg total) by mouth at bedtime., Disp: 30 tablet, Rfl: 1 ?  sildenafil (VIAGRA) 100 MG tablet, Take 0.5-1 tablets (50-100 mg total) by mouth daily as needed for erectile dysfunction., Disp: 6 tablet, Rfl: 5 ? ?Observations/Objective: ?Patient is well-developed, well-nourished in no acute distress.  ?Resting comfortably  at home.  ?Head is normocephalic, atraumatic.  ?No labored breathing.  ?Speech is clear and coherent with logical content.  ?Patient is alert and oriented at baseline.  ? ? ?Assessment and Plan: ?1. Bacterial sinusitis ?- doxycycline (VIBRA-TABS) 100 MG tablet; Take 1 tablet (100 mg total) by mouth 2 (two) times daily for 10 days.  Dispense: 20 tablet; Refill: 0 ?INSTRUCTIONS: use a humidifier for nasal congestion ?Drink plenty of fluids,  rest and wash hands frequently to avoid the spread of infection ?Alternate tylenol and Motrin for relief of fever  ? ?Follow Up Instructions: ?I discussed the assessment and treatment plan with the patient. The patient was provided an opportunity to ask questions and all were answered. The patient agreed with the plan and demonstrated an understanding of the instructions.  A copy of instructions were sent to the patient via MyChart unless otherwise noted below.  ? ? ? ?The patient was advised to call back or seek an in-person evaluation if the symptoms worsen or if the condition fails to improve as anticipated. ? ?Time:  ?I spent 11 minutes with the patient via telehealth technology discussing the above problems/concerns.   ? ?Claiborne Rigg, NP  ?

## 2021-08-30 NOTE — Patient Instructions (Signed)
?  [name], thank you for joining Claiborne Rigg, NP for today's virtual visit.  While this provider is not your primary care provider (PCP), if your PCP is located in our provider database this encounter information will be shared with them immediately following your visit. ? ?Consent: ?(Patient) Billy Mann provided verbal consent for this virtual visit at the beginning of the encounter. ? ?Current Medications: ? ?Current Outpatient Medications:  ?  atorvastatin (LIPITOR) 20 MG tablet, Take 1 tablet (20 mg total) by mouth daily., Disp: 90 tablet, Rfl: 1 ?  buPROPion (WELLBUTRIN XL) 150 MG 24 hr tablet, Take 1 tablet (150 mg total) by mouth every morning., Disp: 30 tablet, Rfl: 1 ?  busPIRone (BUSPAR) 15 MG tablet, Take 1 tablet (15 mg total) by mouth 3 (three) times daily., Disp: 90 tablet, Rfl: 1 ?  diclofenac (VOLTAREN) 75 MG EC tablet, Take 1 tablet (75 mg total) by mouth 2 (two) times daily., Disp: 60 tablet, Rfl: 1 ?  famotidine (PEPCID) 40 MG tablet, Take 1 tablet (40 mg total) by mouth daily. APPOINTMENT NEED FOR REFILLS, Disp: 30 tablet, Rfl: 0 ?  hydrochlorothiazide (HYDRODIURIL) 12.5 MG tablet, Take 1 tablet (12.5 mg total) by mouth daily., Disp: 30 tablet, Rfl: 1 ?  hydrochlorothiazide (HYDRODIURIL) 25 MG tablet, Take 1 tablet (25 mg total) by mouth daily., Disp: 90 tablet, Rfl: 1 ?  lamoTRIgine (LAMICTAL) 25 MG tablet, Take 1 tablet (25 mg total) by mouth daily., Disp: 30 tablet, Rfl: 1 ?  ondansetron (ZOFRAN) 4 MG tablet, Take 1 tablet (4 mg total) by mouth every 8 (eight) hours as needed for nausea or vomiting., Disp: 20 tablet, Rfl: 0 ?  oxyCODONE-acetaminophen (PERCOCET) 5-325 MG tablet, Take 1 tablet by mouth every 8 (eight) hours as needed for severe pain., Disp: 20 tablet, Rfl: 0 ?  pantoprazole (PROTONIX) 40 MG tablet, Take 1 tablet by mouth once daily, Disp: 90 tablet, Rfl: 3 ?  QUEtiapine (SEROQUEL) 50 MG tablet, Take 1 tablet (50 mg total) by mouth at bedtime., Disp: 30 tablet,  Rfl: 1 ?  sildenafil (VIAGRA) 100 MG tablet, Take 0.5-1 tablets (50-100 mg total) by mouth daily as needed for erectile dysfunction., Disp: 6 tablet, Rfl: 5  ? ?Medications ordered in this encounter:  ?No orders of the defined types were placed in this encounter. ?  ? ?*If you need refills on other medications prior to your next appointment, please contact your pharmacy* ? ?Follow-Up: ?Call back or seek an in-person evaluation if the symptoms worsen or if the condition fails to improve as anticipated. ? ?Other Instructions ?INSTRUCTIONS: use a humidifier for nasal congestion ?Drink plenty of fluids, rest and wash hands frequently to avoid the spread of infection ?Alternate tylenol and Motrin for relief of fever  ? ? ?If you have been instructed to have an in-person evaluation today at a local Urgent Care facility, please use the link below. It will take you to a list of all of our available [facility], including address, phone number and hours of operation. Please do not delay care.  ?[facility] ? ?If you or a family member do not have a primary care provider, use the link below to schedule a visit and establish care. When you choose a [facility] primary care physician or advanced practice provider, you gain a long-term partner in health. ?Find a Primary Care Provider ? ?Learn more about [facility] in-office and virtual care options: ?[facility] Get Care Now  ?

## 2021-09-01 ENCOUNTER — Ambulatory Visit (INDEPENDENT_AMBULATORY_CARE_PROVIDER_SITE_OTHER): Payer: 59 | Admitting: Podiatry

## 2021-09-01 ENCOUNTER — Ambulatory Visit (INDEPENDENT_AMBULATORY_CARE_PROVIDER_SITE_OTHER): Payer: 59

## 2021-09-01 DIAGNOSIS — Z9889 Other specified postprocedural states: Secondary | ICD-10-CM

## 2021-09-01 NOTE — Progress Notes (Signed)
? ?  Subjective:  ?Patient presents today status post bunionectomy with hammertoe repair right foot. DOS: 07/03/2021.  Patient states that he is now back at work working 9-hour shifts.  He says that he is doing well other than swelling to the foot.  He presents for further treatment and evaluation ? ?Past Medical History:  ?Diagnosis Date  ? Anxiety   ? Phreesia 03/30/2020  ? Anxiety   ? Depression   ? Phreesia 03/30/2020  ? Depression   ? GERD (gastroesophageal reflux disease)   ? Phreesia 03/30/2020  ? High cholesterol   ? Hyperlipidemia   ? Phreesia 03/30/2020  ? Hypertension   ? Phreesia 03/30/2020  ? Sleep apnea   ? Phreesia 03/30/2020  ? ?  ?Past Surgical History:  ?Procedure Laterality Date  ? FOOT SURGERY    ? bilateral hammer toe  ? TONSILLECTOMY    ? ?Allergies  ?Allergen Reactions  ? Penicillins Anaphylaxis  ? ? ? ?Objective/Physical Exam ?Neurovascular status intact.  Skin incisions healed.  Chronic edema noted to the right forefoot.  Clinically no evidence of infection.  No erythema.  There are some hyperkeratotic calluses noted to the right foot ? ?Radiographic exam RT foot 09/01/2021 ?Good alignment of the rays.  Well-healing surgical foot.  There is some translocation and movement of the capital fragment of the first metatarsal with callus formation around the osteotomy site. ? ?Assessment: ?1. s/p bunionectomy with hammertoe repair 2-5 RT. DOS: 07/03/2021 ? ? ?Plan of Care:  ?1. Patient was evaluated.  X-rays reviewed.  The hyperkeratotic callus lesions were excisionally debrided away as a courtesy for the patient using a 312 scalpel without incident or bleeding ?2.  Compression ankle sleeves dispensed.  Wear daily ?3.  Patient is now back to work 9 hours/day.  Recommend good supportive shoes and sneakers.  Explained that since he is on his feet the majority of the day he may continue to have residual swelling over the next several months.  This should however resolve over time ?4.  Return to clinic as  needed ? ? ?Felecia Shelling, DPM ?[facility] ? ?Dr. Felecia Shelling, DPM  ?  ?[address]                                    ?[place], [place] [address]                ?Office [phone]  ?Fax [phone] ? ? ? ? ? ?

## 2021-09-02 ENCOUNTER — Other Ambulatory Visit: Payer: Self-pay | Admitting: Podiatry

## 2021-09-04 NOTE — Telephone Encounter (Signed)
Please advise 

## 2021-09-05 ENCOUNTER — Other Ambulatory Visit: Payer: Self-pay | Admitting: Podiatry

## 2021-09-05 MED ORDER — OXYCODONE-ACETAMINOPHEN 5-325 MG PO TABS: 1.0000 | ORAL_TABLET | Freq: Three times a day (TID) | ORAL | 0 refills | Status: DC | PRN

## 2021-09-05 NOTE — Telephone Encounter (Signed)
Please advise 

## 2021-09-08 ENCOUNTER — Telehealth: Payer: Self-pay | Admitting: *Deleted

## 2021-09-08 ENCOUNTER — Telehealth: Payer: Self-pay | Admitting: Podiatry

## 2021-09-08 NOTE — Telephone Encounter (Signed)
Pharmacy is calling because of a red flag when trying to dispense pain medicine.he wanted to know why the patient is on medication so long since surgery. Spoke with pharmacist ([place])explained that according last office visit, patient is on feet for nine hours daily , per physician's note he is having swelling and this will resolve over time.

## 2021-09-08 NOTE — Telephone Encounter (Signed)
That's fine. Pharmacy has a point.  Patient is now over 2 months postop.  We will discontinue any pain medication.  Thanks, Dr. Logan Bores

## 2021-09-08 NOTE — Telephone Encounter (Signed)
Diagnosis is postop.  If they have concerns filling the prescription we can discontinue.  Thanks, Dr. Logan Bores

## 2021-09-08 NOTE — Telephone Encounter (Signed)
Pt states he was notified from pharmacy  [facility] [address] [place], [place] - [address] Phone:  [phone]  Fax:  [phone]     His medication  oxyCODONE-acetaminophen (PERCOCET) 5-325 MG tablet  Cannot be filled without a diagnosis.   Please advise.

## 2021-09-09 NOTE — Telephone Encounter (Signed)
Notified pharmacy but patient had picked up already.

## 2021-09-11 ENCOUNTER — Encounter (INDEPENDENT_AMBULATORY_CARE_PROVIDER_SITE_OTHER): Payer: Self-pay

## 2021-09-12 ENCOUNTER — Telehealth: Payer: 59 | Admitting: Emergency Medicine

## 2021-09-12 DIAGNOSIS — L0291 Cutaneous abscess, unspecified: Secondary | ICD-10-CM

## 2021-09-12 MED ORDER — SULFAMETHOXAZOLE-TRIMETHOPRIM 800-160 MG PO TABS: 1.0000 | ORAL_TABLET | Freq: Two times a day (BID) | ORAL | 0 refills | Status: DC

## 2021-09-12 NOTE — Progress Notes (Signed)
Virtual Visit Consent   Billy Mann, you are scheduled for a virtual visit with a [facility] provider today. Just as with appointments in the office, your consent must be obtained to participate. Your consent will be active for this visit and any virtual visit you may have with one of our providers in the next 365 days. If you have a MyChart account, a copy of this consent can be sent to you electronically.  As this is a virtual visit, video technology does not allow for your provider to perform a traditional examination. This may limit your provider's ability to fully assess your condition. If your provider identifies any concerns that need to be evaluated in person or the need to arrange testing (such as labs, EKG, etc.), we will make arrangements to do so. Although advances in technology are sophisticated, we cannot ensure that it will always work on either your end or our end. If the connection with a video visit is poor, the visit may have to be switched to a telephone visit. With either a video or telephone visit, we are not always able to ensure that we have a secure connection.  By engaging in this virtual visit, you consent to the provision of healthcare and authorize for your insurance to be billed (if applicable) for the services provided during this visit. Depending on your insurance coverage, you may receive a charge related to this service.  I need to obtain your verbal consent now. Are you willing to proceed with your visit today? Heitor Steinhoff has provided verbal consent on 09/12/2021 for a virtual visit (video or telephone). Cathlyn Parsons, NP  Date: 09/12/2021 2:09 PM  Virtual Visit via Video Note   I, Cathlyn Parsons, connected with  Jarmar Rousseau  ([id number], 01-31-1981) on 09/12/21 at  2:00 PM EDT by a video-enabled telemedicine application and verified that I am speaking with the correct person using two identifiers.  Location: Patient: Virtual Visit Location Patient:  Home Provider: Virtual Visit Location Provider: Home Office   I discussed the limitations of evaluation and management by telemedicine and the availability of in person appointments. The patient expressed understanding and agreed to proceed.    History of Present Illness: Billy Mann is a 41 y.o. who identifies as a male who was assigned male at birth, and is being seen today for abscess.  Patient reports he had a small pimple-like lesion on his left chest skin 5 days ago that had a whitehead.  The whitehead came off leaving an opening, and now the area is much bigger more inflamed, tender to palpation, and feels like there is "something hard" in there.  He has not been doing anything to treat it other than taking his pain medicine that he has after foot surgery.  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   Diabetes mellitus type 2, controlled (HCC) 05/18/2018    Allergies:  Allergies  Allergen Reactions   Penicillins Anaphylaxis   Medications:  Current Outpatient Medications:    sulfamethoxazole-trimethoprim (BACTRIM DS) 800-160 MG tablet, Take 1 tablet by mouth 2 (two) times daily., Disp: 20 tablet, Rfl: 0   atorvastatin (LIPITOR) 20 MG tablet, Take 1 tablet (20 mg total) by mouth daily., Disp: 90 tablet, Rfl: 1   buPROPion (WELLBUTRIN XL) 150 MG 24 hr tablet, Take 1 tablet (150 mg  total) by mouth every morning., Disp: 30 tablet, Rfl: 1   busPIRone (BUSPAR) 15 MG tablet, Take 1 tablet (15 mg total) by mouth 3 (three) times daily., Disp: 90 tablet, Rfl: 1   diclofenac (VOLTAREN) 75 MG EC tablet, Take 1 tablet (75 mg total) by mouth 2 (two) times daily., Disp: 60 tablet, Rfl: 1   famotidine (PEPCID) 40 MG tablet, Take 1 tablet (40 mg total) by mouth daily. APPOINTMENT NEED FOR REFILLS, Disp: 30 tablet, Rfl: 0   hydrochlorothiazide (HYDRODIURIL) 12.5 MG tablet, Take 1 tablet (12.5 mg total) by mouth daily., Disp: 30 tablet, Rfl: 1   hydrochlorothiazide (HYDRODIURIL) 25 MG tablet, Take 1 tablet (25 mg total) by mouth daily., Disp: 90 tablet, Rfl: 1   lamoTRIgine (LAMICTAL) 25 MG tablet, Take 1 tablet (25 mg total) by mouth daily., Disp: 30 tablet, Rfl: 1   ondansetron (ZOFRAN) 4 MG tablet, Take 1 tablet (4 mg total) by mouth every 8 (eight) hours as needed for nausea or vomiting., Disp: 20 tablet, Rfl: 0   oxyCODONE-acetaminophen (PERCOCET) 5-325 MG tablet, Take 1 tablet by mouth every 8 (eight) hours as needed for severe pain., Disp: 20 tablet, Rfl: 0   pantoprazole (PROTONIX) 40 MG tablet, Take 1 tablet by mouth once daily, Disp: 90 tablet, Rfl: 3   QUEtiapine (SEROQUEL) 50 MG tablet, Take 1 tablet (50 mg total) by mouth at bedtime., Disp: 30 tablet, Rfl: 1   sildenafil (VIAGRA) 100 MG tablet, Take 0.5-1 tablets (50-100 mg total) by mouth daily as needed for erectile dysfunction., Disp: 6 tablet, Rfl: 5  Observations/Objective: Patient is well-developed, well-nourished in no acute distress.  Resting comfortably  at home.  Head is normocephalic, atraumatic.  No labored breathing.  Speech is clear and coherent with logical content.  Patient is alert and oriented at baseline.  He showed me his left chest skin.  There is a maybe 2 mm diameter red area protruding from the skin that has a central crater.  No drainage is visible.  No surrounding erythema or edema.  Assessment and  Plan: 1. Abscess  This is likely an abscess however patient reports the hard area in the center is firm and not squishy.  Therefore, unlikely fluctuant at this time.  Cautioned patient that this area may need to be incised and drained.  Patient prefers to try antibiotics at home along with frequent application of heat/warm compresses.  He understands that if it is not getting better or if it is getting significantly worse, he will need to be seen in person such as at an urgent care.  Patient did just finish a course of doxycycline for sinus infection.  He does feel those symptoms have completely cleared.  He was prescribed doxycycline for that illness, I will prescribe Bactrim for this abscess.  Follow Up Instructions: I discussed the assessment and treatment plan with the patient. The patient was provided an opportunity to ask questions and all were answered. The patient agreed with the plan and demonstrated an understanding of the instructions.  A copy of instructions were sent to the patient via MyChart unless otherwise noted below.  The patient was advised to call back or seek an in-person evaluation if the symptoms worsen or if the condition fails to improve as anticipated.  Time:  I spent 12 minutes with the patient via telehealth technology discussing the above problems/concerns.    Cathlyn ParsonsAngela M Caldwell Kronenberger, NP

## 2021-09-12 NOTE — Patient Instructions (Signed)
[name], thank you for joining Cathlyn Parsons, NP for today's virtual visit.  While this provider is not your primary care provider (PCP), if your PCP is located in our provider database this encounter information will be shared with them immediately following your visit.  Consent: (Patient) Billy Mann provided verbal consent for this virtual visit at the beginning of the encounter.  Current Medications:  Current Outpatient Medications:    sulfamethoxazole-trimethoprim (BACTRIM DS) 800-160 MG tablet, Take 1 tablet by mouth 2 (two) times daily., Disp: 20 tablet, Rfl: 0   atorvastatin (LIPITOR) 20 MG tablet, Take 1 tablet (20 mg total) by mouth daily., Disp: 90 tablet, Rfl: 1   buPROPion (WELLBUTRIN XL) 150 MG 24 hr tablet, Take 1 tablet (150 mg total) by mouth every morning., Disp: 30 tablet, Rfl: 1   busPIRone (BUSPAR) 15 MG tablet, Take 1 tablet (15 mg total) by mouth 3 (three) times daily., Disp: 90 tablet, Rfl: 1   diclofenac (VOLTAREN) 75 MG EC tablet, Take 1 tablet (75 mg total) by mouth 2 (two) times daily., Disp: 60 tablet, Rfl: 1   famotidine (PEPCID) 40 MG tablet, Take 1 tablet (40 mg total) by mouth daily. APPOINTMENT NEED FOR REFILLS, Disp: 30 tablet, Rfl: 0   hydrochlorothiazide (HYDRODIURIL) 12.5 MG tablet, Take 1 tablet (12.5 mg total) by mouth daily., Disp: 30 tablet, Rfl: 1   hydrochlorothiazide (HYDRODIURIL) 25 MG tablet, Take 1 tablet (25 mg total) by mouth daily., Disp: 90 tablet, Rfl: 1   lamoTRIgine (LAMICTAL) 25 MG tablet, Take 1 tablet (25 mg total) by mouth daily., Disp: 30 tablet, Rfl: 1   ondansetron (ZOFRAN) 4 MG tablet, Take 1 tablet (4 mg total) by mouth every 8 (eight) hours as needed for nausea or vomiting., Disp: 20 tablet, Rfl: 0   oxyCODONE-acetaminophen (PERCOCET) 5-325 MG tablet, Take 1 tablet by mouth every 8 (eight) hours as needed for severe pain., Disp: 20 tablet, Rfl: 0   pantoprazole (PROTONIX) 40 MG tablet, Take 1 tablet by mouth once  daily, Disp: 90 tablet, Rfl: 3   QUEtiapine (SEROQUEL) 50 MG tablet, Take 1 tablet (50 mg total) by mouth at bedtime., Disp: 30 tablet, Rfl: 1   sildenafil (VIAGRA) 100 MG tablet, Take 0.5-1 tablets (50-100 mg total) by mouth daily as needed for erectile dysfunction., Disp: 6 tablet, Rfl: 5   Medications ordered in this encounter:  Meds ordered this encounter  Medications   sulfamethoxazole-trimethoprim (BACTRIM DS) 800-160 MG tablet    Sig: Take 1 tablet by mouth 2 (two) times daily.    Dispense:  20 tablet    Refill:  0     *If you need refills on other medications prior to your next appointment, please contact your pharmacy*  Follow-Up: Call back or seek an in-person evaluation if the symptoms worsen or if the condition fails to improve as anticipated.  Other Instructions Apply heat such as a warm compress with a clean washcloth each time, or heating pad, several times a day to the infected area.  If your symptoms are getting significantly worse or if things are not getting better, you will need to be seen in person such as that in urgent care.   If you have been instructed to have an in-person evaluation today at a local Urgent Care facility, please use the link below. It will take you to a list of all of our available [facility], including address, phone number and hours of operation. Please do not delay care.  [facility]  If you or a family member do not have a primary care provider, use the link below to schedule a visit and establish care. When you choose a [facility] primary care physician or advanced practice provider, you gain a long-term partner in health. Find a Primary Care Provider  Learn more about [facility]'s in-office and virtual care options: [facility]

## 2021-09-29 ENCOUNTER — Ambulatory Visit: Payer: 59 | Admitting: Pharmacist

## 2021-09-29 ENCOUNTER — Other Ambulatory Visit: Payer: 59

## 2021-10-01 ENCOUNTER — Ambulatory Visit: Payer: 59 | Admitting: Pharmacist

## 2021-10-01 ENCOUNTER — Other Ambulatory Visit: Payer: 59

## 2021-10-03 ENCOUNTER — Other Ambulatory Visit: Payer: 59

## 2021-10-03 ENCOUNTER — Ambulatory Visit: Payer: 59 | Admitting: Pharmacist

## 2021-10-11 ENCOUNTER — Telehealth: Payer: 59 | Admitting: Nurse Practitioner

## 2021-10-11 ENCOUNTER — Encounter: Payer: Self-pay | Admitting: Nurse Practitioner

## 2021-10-11 DIAGNOSIS — R112 Nausea with vomiting, unspecified: Secondary | ICD-10-CM

## 2021-10-11 MED ORDER — ONDANSETRON HCL 4 MG PO TABS: 4.0000 mg | ORAL_TABLET | Freq: Three times a day (TID) | ORAL | 0 refills | Status: DC | PRN

## 2021-10-21 ENCOUNTER — Telehealth: Payer: 59 | Admitting: Physician Assistant

## 2021-10-21 DIAGNOSIS — K047 Periapical abscess without sinus: Secondary | ICD-10-CM

## 2021-10-21 MED ORDER — CLINDAMYCIN HCL 300 MG PO CAPS: 300.0000 mg | ORAL_CAPSULE | Freq: Four times a day (QID) | ORAL | 0 refills | Status: AC

## 2021-10-21 MED ORDER — NAPROXEN 500 MG PO TABS: 500.0000 mg | ORAL_TABLET | Freq: Two times a day (BID) | ORAL | 0 refills | Status: DC

## 2021-10-21 NOTE — Progress Notes (Signed)

## 2021-10-21 NOTE — Progress Notes (Signed)
I have spent 5 minutes in review of e-visit questionnaire, review and updating patient chart, medical decision making and response to patient.   Jennie Hannay Cody Temperance Kelemen, PA-C    

## 2021-10-22 ENCOUNTER — Telehealth: Payer: Self-pay | Admitting: Nurse Practitioner

## 2021-10-23 ENCOUNTER — Other Ambulatory Visit: Payer: Self-pay | Admitting: Nurse Practitioner

## 2021-10-23 MED ORDER — FAMOTIDINE 40 MG PO TABS: 40.0000 mg | ORAL_TABLET | Freq: Every day | ORAL | 0 refills | Status: DC

## 2021-10-23 NOTE — Addendum Note (Signed)
Addended by: Missy Sabins on: 10/23/2021 01:05 PM   Modules accepted: Orders

## 2021-10-23 NOTE — Telephone Encounter (Signed)
Colleen, please advise if OK to refill Famotidine for 1 month. Pt has a f/u scheduled with you on 11/26/21 at 11 am. Thanks

## 2021-10-23 NOTE — Telephone Encounter (Signed)
Noted. Thanks.

## 2021-10-23 NOTE — Telephone Encounter (Signed)
Patient returned your call, I advised him his refill had to sent.

## 2021-10-23 NOTE — Telephone Encounter (Signed)
Lm on vm for patient to return call.   RX refill for Famotidine sent to [facility] pharmacy on file.

## 2021-10-27 ENCOUNTER — Telehealth (HOSPITAL_COMMUNITY): Payer: Self-pay | Admitting: *Deleted

## 2021-10-27 ENCOUNTER — Other Ambulatory Visit (HOSPITAL_COMMUNITY): Payer: Self-pay | Admitting: Psychiatry

## 2021-10-27 DIAGNOSIS — F411 Generalized anxiety disorder: Secondary | ICD-10-CM

## 2021-10-27 MED ORDER — BUSPIRONE HCL 15 MG PO TABS: 15.0000 mg | ORAL_TABLET | Freq: Three times a day (TID) | ORAL | 3 refills | Status: DC

## 2021-10-27 NOTE — Telephone Encounter (Signed)
Medication refilled and sent to preferred pharmacy

## 2021-10-27 NOTE — Telephone Encounter (Signed)
Patient of Mcneil Sober Sending Requests to Dr Burtis Junes   Rx Refill Request  busPIRone (BUSPAR) 15 MG tablet Take 1 tablet (15 mg total) by mouth 3 (three) times daily

## 2021-11-10 ENCOUNTER — Telehealth: Payer: 59 | Admitting: Physician Assistant

## 2021-11-10 DIAGNOSIS — J4530 Mild persistent asthma, uncomplicated: Secondary | ICD-10-CM | POA: Diagnosis not present

## 2021-11-10 MED ORDER — ALBUTEROL SULFATE HFA 108 (90 BASE) MCG/ACT IN AERS: 1.0000 | INHALATION_SPRAY | Freq: Four times a day (QID) | RESPIRATORY_TRACT | 0 refills | Status: DC | PRN

## 2021-11-10 NOTE — Progress Notes (Signed)
Visit for Asthma  Based on what you have shared with me, it looks like you may have a flare up of your asthma.  Asthma is a chronic (ongoing) lung disease which results in airway obstruction, inflammation and hyper-responsiveness.   Asthma symptoms vary from person to person, with common symptoms including nighttime awakening and decreased ability to participate in normal activities as a result of shortness of breath. It is often triggered by changes in weather, changes in the season, changes in air temperature, or inside (home, school, daycare or work) allergens such as animal dander, mold, mildew, woodstoves or cockroaches.   It can also be triggered by hormonal changes, extreme emotion, physical exertion or an upper respiratory tract illness.     It is important to identify the trigger, and then eliminate or avoid the trigger if possible.   If you have been prescribed medications to be taken on a regular basis, it is important to follow the asthma action plan and to follow guidelines to adjust medication in response to increasing symptoms of decreased peak expiratory flow rate  Treatment: I have prescribed: Albuterol (Proventil HFA; Ventolin HFA) 108 (90 Base) MCG/ACT Inhaler 2 puffs into the lungs every six hours as needed for wheezing or shortness of breath  HOME CARE Only take medications as instructed by your medical team. Consider wearing a mask or scarf to improve breathing air temperature have been shown to decrease irritation and decrease exacerbations Get rest. Taking a steamy shower or using a humidifier may help nasal congestion sand ease sore throat pain. You can place a towel over your head and breathe in the steam from hot water coming from a faucet. Using a saline nasal spray works much the same way.  Cough drops, hare candies and sore throat lozenges may ease your  cough.  Avoid close contacts especially the very you and the elderly Cover your mouth if you cough or sneeze Always remember to wash your hands.    GET HELP RIGHT AWAY IF: You develop worsening symptoms; breathlessness at rest, drowsy, confused or agitated, unable to speak in full sentences You have coughing fits You develop a severe headache or visual changes You develop shortness of breath, difficulty breathing or start having chest pain Your symptoms persist after you have completed your treatment plan If your symptoms do not improve within 10 days  MAKE SURE YOU Understand these instructions. Will watch your condition. Will get help right away if you are not doing well or get worse.   Your e-visit answers were reviewed by a board certified advanced clinical practitioner to complete your personal care plan, Depending upon the condition, your plan could have included both over the counter or prescription medications.   Please review your pharmacy choice. Your safety is important to us. If you have drug allergies check your prescription carefully.  You can use MyChart to ask questions about today's visit, request a non-urgent  call back, or ask for a work or school excuse for 24 hours related to this e-Visit. If it has been greater than 24 hours you will need to follow up with your provider, or enter a new e-Visit to address those concerns.   You will get an e-mail in the next two days asking about your experience. I hope that your e-visit has been valuable and will speed your recovery. Thank you for using e-visits.  I provided 5 minutes of non face-to-face time during this encounter for chart review and documentation.   

## 2021-11-11 ENCOUNTER — Other Ambulatory Visit: Payer: Self-pay | Admitting: Nurse Practitioner

## 2021-11-11 ENCOUNTER — Other Ambulatory Visit (HOSPITAL_COMMUNITY): Payer: Self-pay | Admitting: Psychiatry

## 2021-11-11 DIAGNOSIS — F411 Generalized anxiety disorder: Secondary | ICD-10-CM

## 2021-11-11 DIAGNOSIS — F323 Major depressive disorder, single episode, severe with psychotic features: Secondary | ICD-10-CM

## 2021-11-11 DIAGNOSIS — K219 Gastro-esophageal reflux disease without esophagitis: Secondary | ICD-10-CM

## 2021-11-13 NOTE — Telephone Encounter (Signed)
Received request for refill of Buspar 10 mg TID.  This was declined due to patient's dose being changed at last appointment.     Arna Snipe MD Resident

## 2021-11-22 ENCOUNTER — Telehealth: Payer: 59 | Admitting: Nurse Practitioner

## 2021-11-22 DIAGNOSIS — H5789 Other specified disorders of eye and adnexa: Secondary | ICD-10-CM

## 2021-11-22 MED ORDER — OFLOXACIN 0.3 % OP SOLN: 1.0000 [drp] | Freq: Four times a day (QID) | OPHTHALMIC | 0 refills | Status: DC

## 2021-11-22 NOTE — Progress Notes (Signed)

## 2021-11-24 MED ORDER — ALBUTEROL SULFATE HFA 108 (90 BASE) MCG/ACT IN AERS: 1.0000 | INHALATION_SPRAY | Freq: Four times a day (QID) | RESPIRATORY_TRACT | 0 refills | Status: DC | PRN

## 2021-11-24 NOTE — Addendum Note (Signed)
Addended by: Margaretann Loveless on: 11/24/2021 11:06 AM   Modules accepted: Orders

## 2021-11-25 NOTE — Progress Notes (Unsigned)
     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 Past tonsillectomy and bilateral hammertoe surgery. He is followed by Dr. Christella Hartigan.      Latest Ref Rng & Units 01/31/2021    6:54 PM 06/10/2020    3:17 PM 04/27/2020   10:51 PM  CBC  WBC 3.4 - 10.8 x10E3/uL 5.4  8.7  13.5   Hemoglobin 13.0 - 17.7 g/dL 84.6  65.9  93.5   Hematocrit 37.5 - 51.0 % 37.5  40.0  42.3   Platelets 150 - 450 x10E3/uL 163  183.0  215        Latest Ref Rng & Units 01/31/2021    6:54 PM 06/10/2020    3:17 PM 04/27/2020   10:51 PM  CMP  Glucose 70 - 99 mg/dL 701  99  92   BUN 6 - 24 mg/dL 19  21  15    Creatinine 0.76 - 1.27 mg/dL  7.79  3.90   Sodium 134 - 144 mmol/L 136  139  138   Potassium 3.5 - 5.2 mmol/L 4.5  4.1  3.1   Chloride 96 - 106 mmol/L 104  105  109   CO2 20 - 29 mmol/L 17  27  18    Calcium 8.7 - 10.2 mg/dL 9.2  9.4  7.7   Total Protein 6.0 - 8.5 g/dL 6.6  7.1  5.7   Total Bilirubin 0.0 - 1.2 mg/dL 0.5  1.1  1.7   Alkaline Phos 44 - 121 IU/L 94  72  54   AST 0 - 40 IU/L 24  38  35   ALT 0 - 44 IU/L 28  67  40        Current Medications, Allergies, Past Medical History, Past Surgical History, Family History and Social History were reviewed in 3.00 record.   Review of Systems:   Constitutional: Negative for fever, sweats, chills or weight loss.  Respiratory: Negative for shortness of breath.   Cardiovascular: Negative for chest pain, palpitations and leg swelling.  Gastrointestinal: See HPI.  Musculoskeletal: Negative for back pain or muscle aches.  Neurological: Negative for dizziness, headaches or paresthesias.    Physical Exam: There were no vitals taken for this visit. General: Well developed, w   ***male in  no acute distress. Head: Normocephalic and atraumatic. Eyes: No scleral icterus. Conjunctiva pink . Ears: Normal auditory acuity. Mouth: Dentition intact. No ulcers or lesions.  Lungs: Clear throughout to auscultation. Heart: Regular rate and rhythm, no murmur. Abdomen: Soft, nontender and nondistended. No masses or hepatomegaly. Normal bowel sounds x 4 quadrants.  Rectal: *** Musculoskeletal: Symmetrical with no gross deformities. Extremities: No edema. Neurological: Alert oriented x 4. No focal deficits.  Psychological: Alert and cooperative. Normal mood and affect  Assessment and Recommendations: ***

## 2021-11-26 ENCOUNTER — Ambulatory Visit: Payer: 59 | Admitting: Nurse Practitioner

## 2021-12-01 ENCOUNTER — Other Ambulatory Visit: Payer: Self-pay | Admitting: Nurse Practitioner

## 2021-12-01 ENCOUNTER — Telehealth (HOSPITAL_COMMUNITY): Payer: Self-pay | Admitting: *Deleted

## 2021-12-01 NOTE — Telephone Encounter (Signed)
Refill Request  for 90-day Supply  by [facility]  QUEtiapine (SEROQUEL) 50 MG tablet denied patient last seen 08/27/21 by Fuller Canada, NP

## 2021-12-10 ENCOUNTER — Telehealth (HOSPITAL_COMMUNITY): Payer: Self-pay

## 2021-12-10 ENCOUNTER — Other Ambulatory Visit (HOSPITAL_COMMUNITY): Payer: Self-pay | Admitting: Psychiatry

## 2021-12-10 DIAGNOSIS — F900 Attention-deficit hyperactivity disorder, predominantly inattentive type: Secondary | ICD-10-CM

## 2021-12-10 DIAGNOSIS — F3181 Bipolar II disorder: Secondary | ICD-10-CM

## 2021-12-10 DIAGNOSIS — F411 Generalized anxiety disorder: Secondary | ICD-10-CM

## 2021-12-10 DIAGNOSIS — F17200 Nicotine dependence, unspecified, uncomplicated: Secondary | ICD-10-CM

## 2021-12-10 MED ORDER — LAMOTRIGINE 25 MG PO TABS: 25.0000 mg | ORAL_TABLET | Freq: Every day | ORAL | 1 refills | Status: DC

## 2021-12-10 MED ORDER — BUPROPION HCL ER (XL) 150 MG PO TB24: 150.0000 mg | ORAL_TABLET | ORAL | 1 refills | Status: DC

## 2021-12-10 MED ORDER — QUETIAPINE FUMARATE 50 MG PO TABS: 50.0000 mg | ORAL_TABLET | Freq: Every day | ORAL | 1 refills | Status: DC

## 2021-12-10 MED ORDER — BUSPIRONE HCL 15 MG PO TABS: 15.0000 mg | ORAL_TABLET | Freq: Three times a day (TID) | ORAL | 3 refills | Status: DC

## 2021-12-10 NOTE — Telephone Encounter (Signed)
Medication refilled and sent to preferred pharmacy.  Patient will need to reschedule a visit for further refills.  Provider left voicemail detailing this.

## 2021-12-11 ENCOUNTER — Other Ambulatory Visit (HOSPITAL_COMMUNITY): Payer: Self-pay | Admitting: Psychiatry

## 2021-12-11 DIAGNOSIS — F3181 Bipolar II disorder: Secondary | ICD-10-CM

## 2021-12-12 ENCOUNTER — Other Ambulatory Visit (HOSPITAL_COMMUNITY): Payer: Self-pay | Admitting: Psychiatry

## 2021-12-12 ENCOUNTER — Telehealth (HOSPITAL_COMMUNITY): Payer: Self-pay | Admitting: *Deleted

## 2021-12-12 DIAGNOSIS — F17200 Nicotine dependence, unspecified, uncomplicated: Secondary | ICD-10-CM

## 2021-12-12 DIAGNOSIS — F900 Attention-deficit hyperactivity disorder, predominantly inattentive type: Secondary | ICD-10-CM

## 2021-12-12 DIAGNOSIS — F3181 Bipolar II disorder: Secondary | ICD-10-CM

## 2021-12-12 DIAGNOSIS — F411 Generalized anxiety disorder: Secondary | ICD-10-CM

## 2021-12-12 MED ORDER — BUPROPION HCL ER (XL) 150 MG PO TB24: 150.0000 mg | ORAL_TABLET | ORAL | 3 refills | Status: DC

## 2021-12-12 MED ORDER — QUETIAPINE FUMARATE 50 MG PO TABS: 50.0000 mg | ORAL_TABLET | Freq: Every day | ORAL | 3 refills | Status: DC

## 2021-12-12 MED ORDER — BUSPIRONE HCL 15 MG PO TABS: 15.0000 mg | ORAL_TABLET | Freq: Three times a day (TID) | ORAL | 3 refills | Status: DC

## 2021-12-12 MED ORDER — LAMOTRIGINE 25 MG PO TABS: 25.0000 mg | ORAL_TABLET | Freq: Every day | ORAL | 3 refills | Status: DC

## 2021-12-12 NOTE — Telephone Encounter (Signed)
[id number] - [place], [place] - [address] AT [facility] [address]   Called because Patient [facility]   When called bt [facility] informed they couldn't transfer his because BECAUSE  they have deactivated his account already.  So  [facility]  is requesting medication's to  be sent to them

## 2021-12-12 NOTE — Telephone Encounter (Signed)
VM left that he could not accept blocked numbers and he needs his medicine sent to [facility] on [address]. Shortly after he left a VM [facility] [address] sent a request that they had [facility] to [facility] to have [facility] to the [facility] but they had not gotten any response from [facility] so they also reached out to [place] to send his meds to them. I will forward this request to Dr Doyne Keel.

## 2021-12-12 NOTE — Telephone Encounter (Signed)
This

## 2021-12-12 NOTE — Telephone Encounter (Signed)
Medication refilled and sent to [facility] on [address]  Patient has been scheduled for Nov 8th @ 11:30.  He will need to login virtually or come in person for future visit for further refills.

## 2021-12-17 ENCOUNTER — Other Ambulatory Visit (HOSPITAL_COMMUNITY): Payer: Self-pay

## 2022-01-01 ENCOUNTER — Telehealth: Payer: Self-pay | Admitting: Physician Assistant

## 2022-01-01 DIAGNOSIS — M5442 Lumbago with sciatica, left side: Secondary | ICD-10-CM

## 2022-01-01 MED ORDER — NAPROXEN 500 MG PO TABS: 500.0000 mg | ORAL_TABLET | Freq: Two times a day (BID) | ORAL | 0 refills | Status: DC

## 2022-01-01 MED ORDER — CYCLOBENZAPRINE HCL 10 MG PO TABS: 10.0000 mg | ORAL_TABLET | Freq: Three times a day (TID) | ORAL | 0 refills | Status: DC | PRN

## 2022-01-01 NOTE — Progress Notes (Signed)
Message sent to patient requesting further input regarding current symptoms. Awaiting patient response.  

## 2022-01-01 NOTE — Progress Notes (Signed)
I have spent 5 minutes in review of e-visit questionnaire, review and updating patient chart, medical decision making and response to patient.   Lainy Wrobleski Cody Tymika Grilli, PA-C    

## 2022-01-01 NOTE — Progress Notes (Signed)

## 2022-01-19 ENCOUNTER — Other Ambulatory Visit (HOSPITAL_COMMUNITY): Payer: Self-pay

## 2022-01-21 ENCOUNTER — Ambulatory Visit: Payer: Self-pay | Admitting: Pharmacist

## 2022-01-27 ENCOUNTER — Other Ambulatory Visit (HOSPITAL_COMMUNITY): Payer: Self-pay

## 2022-01-28 ENCOUNTER — Ambulatory Visit: Payer: Self-pay | Admitting: Pharmacist

## 2022-01-29 ENCOUNTER — Telehealth: Payer: Self-pay | Admitting: Family Medicine

## 2022-01-29 DIAGNOSIS — J3089 Other allergic rhinitis: Secondary | ICD-10-CM

## 2022-01-29 NOTE — Progress Notes (Signed)
E visit for Allergic Rhinitis We are sorry that you are not feeling well.  Here is how we plan to help!  Based on what you have shared with me it looks like you have Allergic Rhinitis.  Rhinitis is when a reaction occurs that causes nasal congestion, runny nose, sneezing, and itching.  Most types of rhinitis are caused by an inflammation and are associated with symptoms in the eyes ears or throat. There are several types of rhinitis.  The most common are acute rhinitis, which is usually caused by a viral illness, allergic or seasonal rhinitis, and nonallergic or year-round rhinitis.  Nasal allergies occur certain times of the year.  Allergic rhinitis is caused when allergens in the air trigger the release of histamine in the body.  Histamine causes itching, swelling, and fluid to build up in the fragile linings of the nasal passages, sinuses and eyelids.  An itchy nose and clear discharge are common.  I recommend the following over the counter treatments: Xyzal 5 mg take 1 tablet daily  I also would recommend a nasal spray: Flonase 2 sprays into each nostril once daily  You may also benefit from eye drops such as: Systane 1-2 driops each eye twice daily as needed  HOME CARE:  You can use an over-the-counter saline nasal spray as needed Avoid areas where there is heavy dust, mites, or molds Stay indoors on windy days during the pollen season Keep windows closed in home, at least in bedroom; use air conditioner. Use high-efficiency house air filter Keep windows closed in car, turn AC on re-circulate Avoid playing out with dog during pollen season  GET HELP RIGHT AWAY IF:  If your symptoms do not improve within 10 days You become short of breath You develop yellow or green discharge from your nose for over 3 days You have coughing fits  MAKE SURE YOU:  Understand these instructions Will watch your condition Will get help right away if you are not doing well or get worse  Thank you  for choosing an e-visit. Your e-visit answers were reviewed by a board certified advanced clinical practitioner to complete your personal care plan. Depending upon the condition, your plan could have included both over the counter or prescription medications. Please review your pharmacy choice. Be sure that the pharmacy you have chosen is open so that you can pick up your prescription now.  If there is a problem you may message your provider in [place] to have the prescription routed to another pharmacy. Your safety is important to [place]. If you have drug allergies check your prescription carefully.  For the next 24 hours, you can use MyChart to ask questions about today's visit, request a non-urgent call back, or ask for a work or school excuse from your e-visit provider. You will get an email in the next two days asking about your experience. I hope that your e-visit has been valuable and will speed your recovery.    I provided 5 minutes of non face-to-face time during this encounter for chart review, medication and order placement, as well as and documentation.

## 2022-02-04 ENCOUNTER — Encounter (HOSPITAL_COMMUNITY): Payer: Self-pay

## 2022-02-04 ENCOUNTER — Telehealth (HOSPITAL_COMMUNITY): Payer: Self-pay | Admitting: Mental Health

## 2022-02-04 ENCOUNTER — Ambulatory Visit (HOSPITAL_COMMUNITY): Payer: No Payment, Other | Admitting: Mental Health

## 2022-02-04 NOTE — Telephone Encounter (Signed)
Pt failed to connect to [place] for virtual appointment. Therapist contacted client via telephone in which no answer. Left voicemail to reschedule appointment if still interested in OPT

## 2022-02-14 ENCOUNTER — Telehealth: Payer: Self-pay | Admitting: Family

## 2022-02-14 DIAGNOSIS — K0889 Other specified disorders of teeth and supporting structures: Secondary | ICD-10-CM

## 2022-02-14 DIAGNOSIS — K047 Periapical abscess without sinus: Secondary | ICD-10-CM

## 2022-02-14 MED ORDER — CLINDAMYCIN HCL 300 MG PO CAPS: 300.0000 mg | ORAL_CAPSULE | Freq: Four times a day (QID) | ORAL | 0 refills | Status: AC

## 2022-02-14 NOTE — Progress Notes (Signed)

## 2022-02-24 ENCOUNTER — Telehealth (HOSPITAL_COMMUNITY): Payer: Self-pay | Admitting: Mental Health

## 2022-02-24 ENCOUNTER — Ambulatory Visit (HOSPITAL_COMMUNITY): Payer: Self-pay | Admitting: Mental Health

## 2022-02-24 DIAGNOSIS — F411 Generalized anxiety disorder: Secondary | ICD-10-CM

## 2022-02-24 NOTE — Telephone Encounter (Signed)
Therapist sent link for tele-therapy appointment via [facility]. No response after x 10 minutes. Contacted via telephone; no answer. Unable to leave voicemail due to voicemail not picking up.

## 2022-02-25 ENCOUNTER — Telehealth (HOSPITAL_COMMUNITY): Payer: Self-pay | Admitting: Psychiatry

## 2022-02-25 NOTE — Progress Notes (Signed)
  Therapist sent link for tele-therapy appointment via [facility]. No response after x 10 minutes. Contacted via telephone; no answer. Unable to leave voicemail due to voicemail not picking up.

## 2022-03-09 ENCOUNTER — Other Ambulatory Visit: Payer: Self-pay | Admitting: Family Medicine

## 2022-03-09 DIAGNOSIS — N529 Male erectile dysfunction, unspecified: Secondary | ICD-10-CM

## 2022-03-20 ENCOUNTER — Other Ambulatory Visit: Payer: Self-pay | Admitting: Family Medicine

## 2022-03-20 DIAGNOSIS — N529 Male erectile dysfunction, unspecified: Secondary | ICD-10-CM

## 2022-03-20 NOTE — Telephone Encounter (Signed)
Requested medication (s) are due for refill today: yes  Requested medication (s) are on the active medication list: yes  Last refill:  08/14/21  Future visit scheduled: no  Notes to clinic:  *PATIENT'S INSURANCE PLAN COVERS ONLY 6 TABLETS PER 30 DAYS* , routing for review     Requested Prescriptions  Pending Prescriptions Disp Refills   sildenafil (VIAGRA) 100 MG tablet 6 tablet 5    Sig: Take 0.5-1 tablets (50-100 mg total) by mouth daily as needed for erectile dysfunction.     Urology: Erectile Dysfunction Agents Failed - 03/20/2022 12:58 PM      Failed - AST in normal range and within 360 days    AST  Date Value Ref Range Status  01/31/2021 24 0 - 40 IU/L Final         Failed - ALT in normal range and within 360 days    ALT  Date Value Ref Range Status  01/31/2021 28 0 - 44 IU/L Final         Failed - Last BP in normal range    BP Readings from Last 1 Encounters:  08/14/21 (!) 146/90         Passed - Valid encounter within last 12 months    Recent Outpatient Visits           7 months ago Primary hypertension   Primary Care at [facility], MD   10 months ago Anxiety and depression   Primary Care at [facility], Gildardo Pounds, NP   1 year ago Bilateral carpal tunnel syndrome   Primary Care at [facility], Kandee Keen, MD   1 year ago Gastroesophageal reflux disease, unspecified whether esophagitis present   Primary Care at [facility], Kandee Keen, MD   1 year ago Encounter to establish care   Primary Care at [facility], Kandee Keen, MD

## 2022-03-20 NOTE — Telephone Encounter (Unsigned)
Copied from CRM [id number]. Topic: General - Other >> Mar 20, 2022 11:57 AM Everette C wrote: Reason for CRM: Medication Refill - Medication: sildenafil (VIAGRA) 100 MG tablet [[id number]]  Has the patient contacted their pharmacy? Yes.  The patient has been directed to contact their PCP  (Agent: If no, request that the patient contact the pharmacy for the refill. If patient does not wish to contact the pharmacy document the reason why and proceed with request.) (Agent: If yes, when and what did the pharmacy advise?)  Preferred Pharmacy (with phone number or street name): [facility] [address] [place], [place] - [address] [place] [address] Phone: [phone] Fax: [phone] Hours: Not open 24 hours   Has the patient been seen for an appointment in the last year OR does the patient have an upcoming appointment? Yes.    Agent: Please be advised that RX refills may take up to 3 business days. We ask that you follow-up with your pharmacy.

## 2022-03-23 MED ORDER — SILDENAFIL CITRATE 100 MG PO TABS: 50.0000 mg | ORAL_TABLET | Freq: Every day | ORAL | 0 refills | Status: DC | PRN

## 2022-04-22 ENCOUNTER — Ambulatory Visit: Payer: 59 | Admitting: Physician Assistant

## 2022-05-21 IMAGING — CT CT HEAD W/O CM
4 of 8 series · 13 of 47 positions shown, 15 images · non-contrast
Comparison: None.

CLINICAL DATA: Delirium, last methamphetamine use 3 days prior,
history of substance abuse and mental illness

EXAM:
CT HEAD WITHOUT CONTRAST
TECHNIQUE: Contiguous axial images were obtained from the base of the skull
through the vertex without intravenous contrast.

[Series 8: coronal soft tissue · coronal · 0.36mm/px · 3 of 76 slices shown]
[im 19/76  brain]
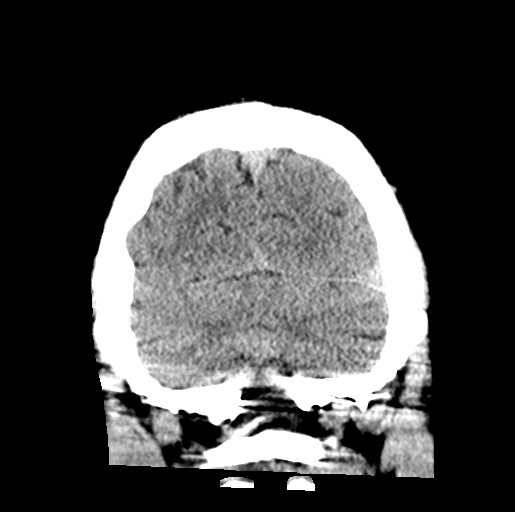
[im 38/76  brain]
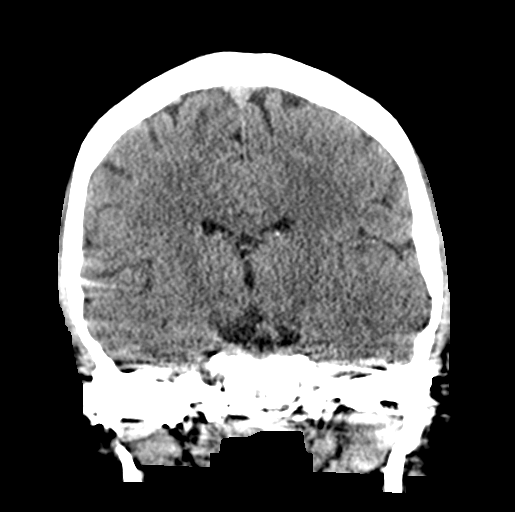
[im 57/76  brain]
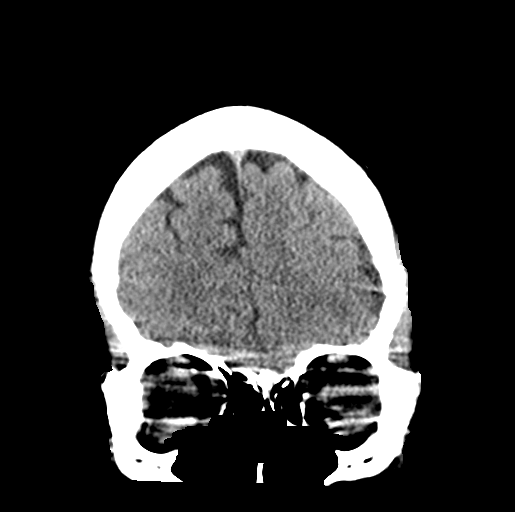

[Series 9: sagittal soft tissue · sagittal · 0.36mm/px · 2 of 58 slices shown]
[im 20/58  brain]
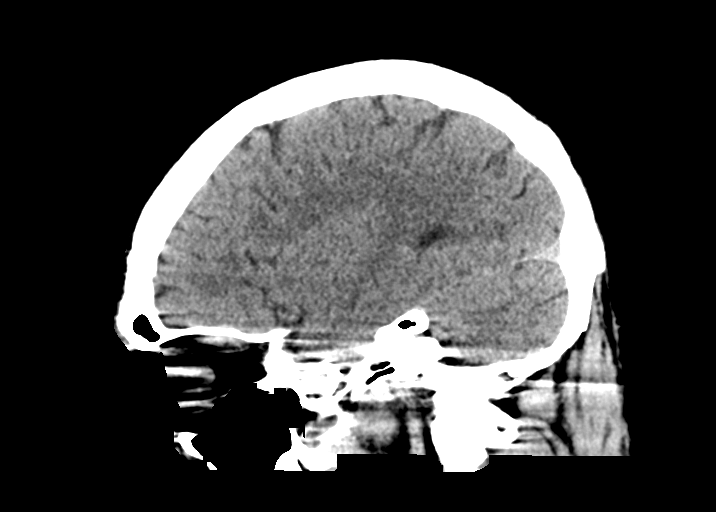
[im 39/58  brain]
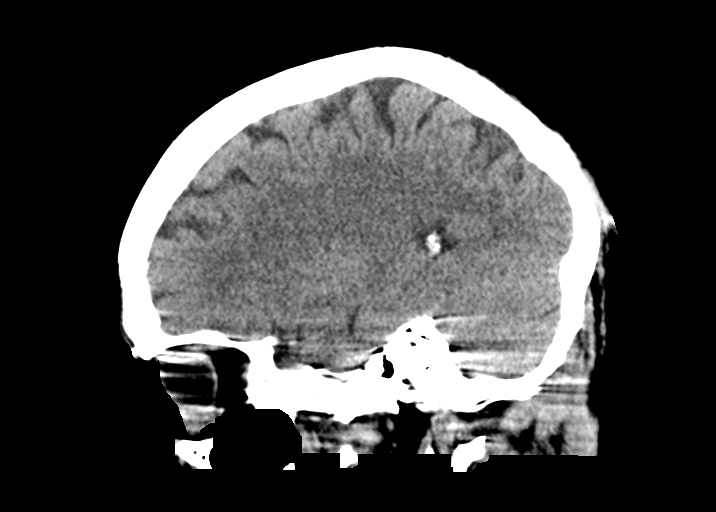

[Series 12: true axial · axial · 0.35mm/px · 1 of 59 slices shown]
[im 8/59  brain]
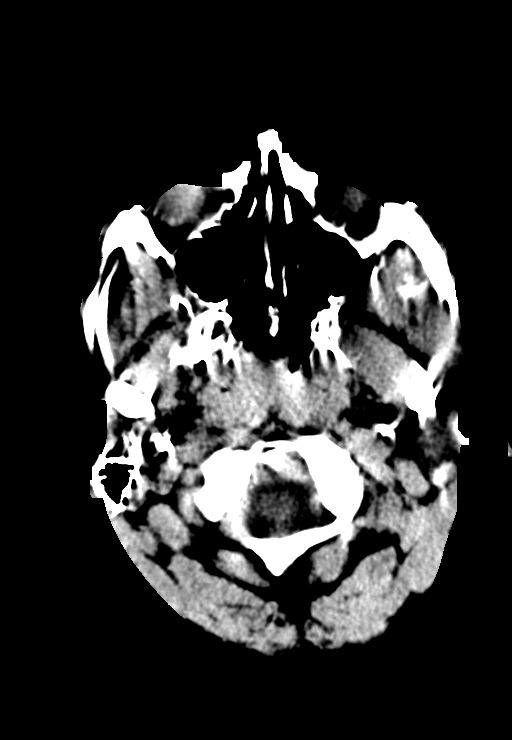

[Series 13: true axial 2 · axial · 0.34mm/px · z∈[-139,-15]mm · 7 of 58 slices shown, 9 images]
[im 8/58  brain]
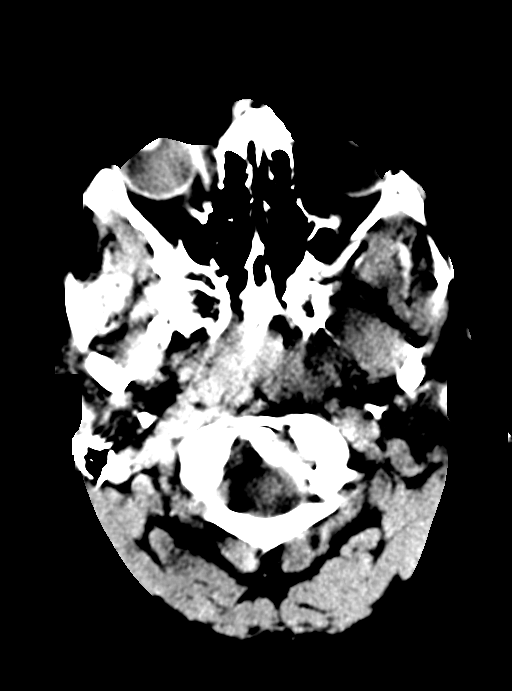
[im 8/58  bone]
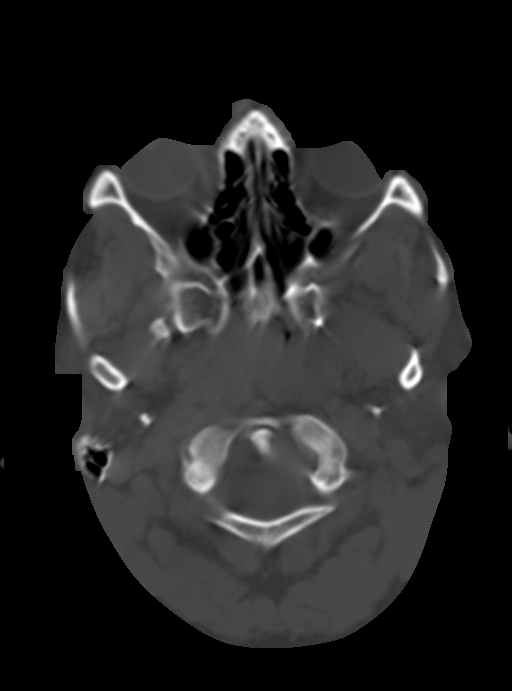
[im 15/58  brain]
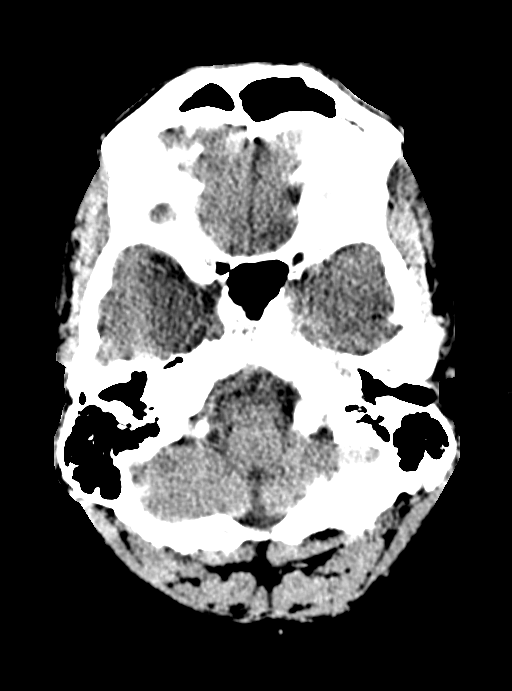
[im 22/58  brain]
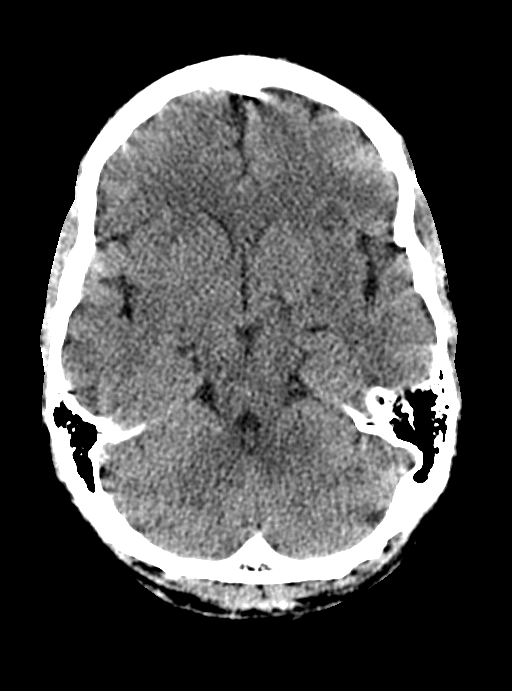
[im 29/58  brain]
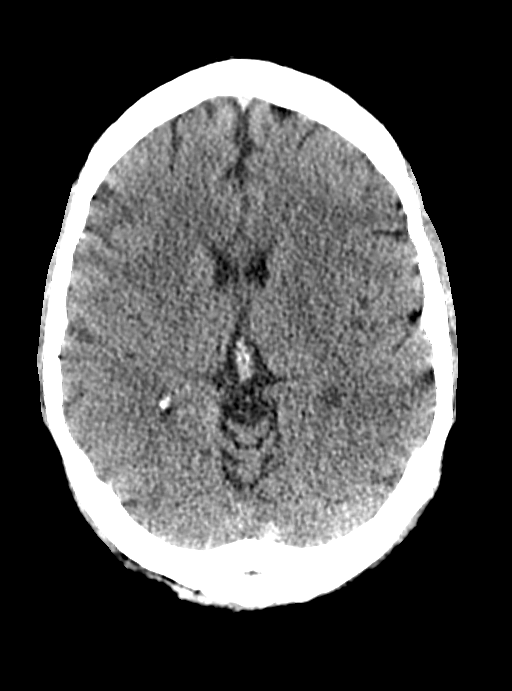
[im 36/58  brain]
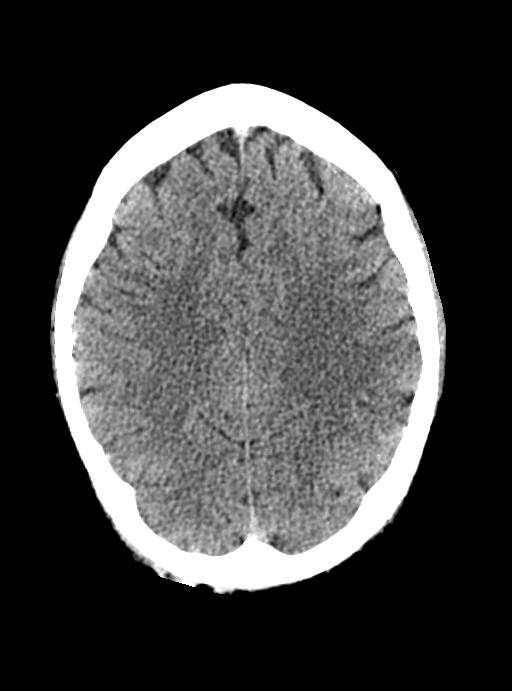
[im 36/58  bone]
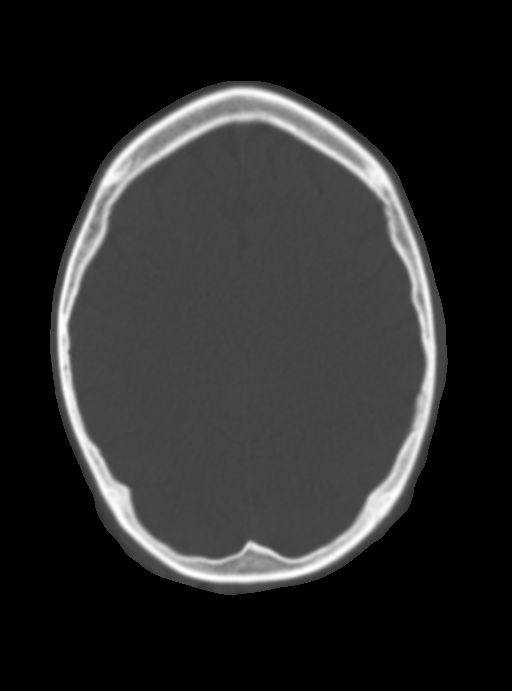
[im 43/58  brain]
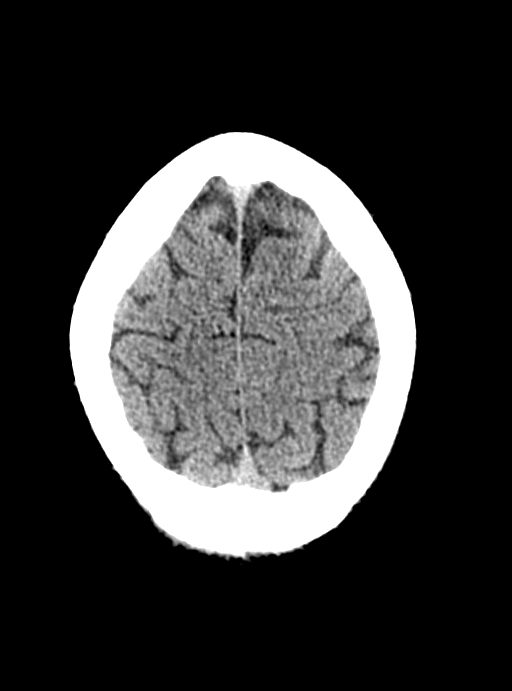
[im 50/58  brain]
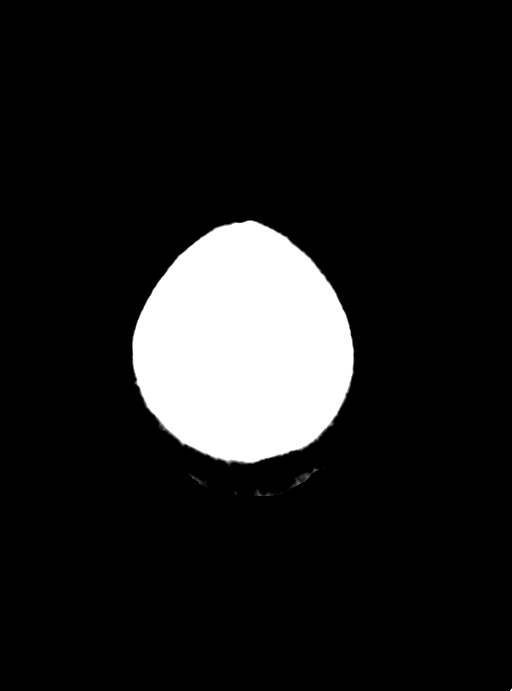

[13 of 47 positions shown; findings below may reference images not displayed]

FINDINGS: Brain: Motion artifact towards the posterior fossa and skull base
may limit detection of subtle anomaly. No evidence of acute
infarction, hemorrhage, hydrocephalus, extra-axial collection,
visible mass lesion or mass effect.

Vascular: No hyperdense vessel or unexpected calcification.

Skull: There is right occipital scalp swelling and infiltration,
possibly some contusive change without large hematoma or subjacent
calvarial fracture. No other scalp or osseous abnormalities are
evident.

Sinuses/Orbits: Paranasal sinuses and mastoid air cells are
predominantly clear. Included orbital structures are unremarkable.

Other: None
IMPRESSION: 1. No acute intracranial abnormality.
2. Right occipital scalp swelling and infiltration, possibly some
contusive change without large hematoma or subjacent calvarial
fracture. Correlate for point tenderness.

## 2022-06-03 ENCOUNTER — Other Ambulatory Visit: Payer: Self-pay | Admitting: Family Medicine

## 2022-06-03 DIAGNOSIS — N529 Male erectile dysfunction, unspecified: Secondary | ICD-10-CM

## 2022-06-04 NOTE — Telephone Encounter (Signed)
Requested medication (s) are due for refill today: yes  Requested medication (s) are on the active medication list: yes  Last refill:  03/23/22  Future visit scheduled: no  Notes to clinic:  Unable to refill per protocol due to failed labs, no updated results.      Requested Prescriptions  Pending Prescriptions Disp Refills   sildenafil (VIAGRA) 100 MG tablet [Pharmacy Med Name: Sildenafil Citrate 100 MG Oral Tablet] 6 tablet 0    Sig: TAKE 1/2 TO 1 (ONE-HALF TO ONE) TABLET BY MOUTH ONCE DAILY AS NEEDED FOR ERECTILE DYSFUNCTION     Urology: Erectile Dysfunction Agents Failed - 06/03/2022  8:48 PM      Failed - AST in normal range and within 360 days    AST  Date Value Ref Range Status  01/31/2021 24 0 - 40 IU/L Final         Failed - ALT in normal range and within 360 days    ALT  Date Value Ref Range Status  01/31/2021 28 0 - 44 IU/L Final         Failed - Last BP in normal range    BP Readings from Last 1 Encounters:  08/14/21 (!) 146/90         Passed - Valid encounter within last 12 months    Recent Outpatient Visits           9 months ago Primary hypertension   [place] [facility] at [facility], MD   1 year ago Anxiety and depression   [place] [facility] at [facility], Kriste Basque, NP   2 years ago Bilateral carpal tunnel syndrome   [place] [facility] at [facility], Bayard Beaver, MD   2 years ago Gastroesophageal reflux disease, unspecified whether esophagitis present   [facility] at [facility], Bayard Beaver, MD   2 years ago Encounter to establish care   [facility] Care at [facility], Bayard Beaver, MD

## 2022-08-10 ENCOUNTER — Encounter: Payer: Self-pay | Admitting: Podiatry

## 2022-08-10 ENCOUNTER — Telehealth: Payer: Self-pay | Admitting: Physician Assistant

## 2022-08-10 ENCOUNTER — Other Ambulatory Visit: Payer: Self-pay | Admitting: Family Medicine

## 2022-08-10 DIAGNOSIS — N529 Male erectile dysfunction, unspecified: Secondary | ICD-10-CM

## 2022-08-10 DIAGNOSIS — R3989 Other symptoms and signs involving the genitourinary system: Secondary | ICD-10-CM

## 2022-08-10 NOTE — Progress Notes (Signed)
E-Visit for Urinary Problems  Based on what you shared with me, I feel your condition warrants further evaluation and I recommend that you be seen for a face to face office visit.  Male bladder infections are not very common.  We worry about prostate or kidney conditions.  The standard of care is to examine the abdomen and kidneys, and to do a urine and blood test to make sure that something more serious is not going on.  We recommend that you see a provider today.  If your doctor's office is closed [facility] has the following Urgent Cares:    NOTE: You will not be charged for this e-visit.  If you are having a true medical emergency please call 911.       For an urgent face to face visit, [place] has six urgent care centers for your convenience:     [facility] [place] Get Driving Directions [phone] [address] [place], [place] [address]    [facility] ([place]) Get Driving Directions [phone] [address] [place], [place] [address]  [facility] ([place]) Get Driving Directions [phone] [address] ,  [place]  [address]  [facility] Get Driving Directions [phone] [address] [place], [place] [address]   [facility] [place] Get Driving Directions  [phone] [address] [place], [place] [address]   [facility] [place] Get Driving Directions [phone] [address] [place], [place] [address]  Your MyChart E-visit questionnaire answers were reviewed by a board certified advanced clinical practitioner to complete your personal care plan based on your specific symptoms.  Thank you for using e-Visits.     I have spent 5 minutes in review of e-visit questionnaire, review and updating patient chart, medical  decision making and response to patient.   Domenica Weightman M Ricky Gallery, PA-C  

## 2022-08-21 ENCOUNTER — Ambulatory Visit: Payer: Self-pay

## 2022-08-23 ENCOUNTER — Telehealth: Payer: BLUE CROSS/BLUE SHIELD | Admitting: Nurse Practitioner

## 2022-08-23 DIAGNOSIS — L739 Follicular disorder, unspecified: Secondary | ICD-10-CM

## 2022-08-23 MED ORDER — MUPIROCIN 2 % EX OINT: 1.0000 | TOPICAL_OINTMENT | Freq: Two times a day (BID) | CUTANEOUS | 0 refills | Status: DC

## 2022-08-23 NOTE — Progress Notes (Signed)
E Visit for Rash  We are sorry that you are not feeling well. Here is how we plan to help!   Based upon what you have shared with me it looks like you have a bacterial follicultits.  Folliculitis is inflammation of the hair follicles that can be caused by a superficial infection of the skin and is treated with an antibiotic. I have prescribed: and Topical mupiricin      HOME CARE:  Take cool showers and avoid direct sunlight. Apply cool compress or wet dressings. Take a bath in an oatmeal bath.  Sprinkle content of one Aveeno packet under running faucet with comfortably warm water.  Bathe for 15-20 minutes, 1-2 times daily.  Pat dry with a towel. Do not rub the rash. Use hydrocortisone cream. Take an antihistamine like Benadryl for widespread rashes that itch.  The adult dose of Benadryl is 25-50 mg by mouth 4 times daily. Caution:  This type of medication may cause sleepiness.  Do not drink alcohol, drive, or operate dangerous machinery while taking antihistamines.  Do not take these medications if you have prostate enlargement.  Read package instructions thoroughly on all medications that you take.  GET HELP RIGHT AWAY IF:  Symptoms don't go away after treatment. Severe itching that persists. If you rash spreads or swells. If you rash begins to smell. If it blisters and opens or develops a yellow-brown crust. You develop a fever. You have a sore throat. You become short of breath.  MAKE SURE YOU:  Understand these instructions. Will watch your condition. Will get help right away if you are not doing well or get worse.  Thank you for choosing an e-visit.  Your e-visit answers were reviewed by a board certified advanced clinical practitioner to complete your personal care plan. Depending upon the condition, your plan could have included both over the counter or prescription medications.  Please review your pharmacy choice. Make sure the pharmacy is open so you can pick up  prescription now. If there is a problem, you may contact your provider through [employer] and have the prescription routed to another pharmacy.  Your safety is important to [place]. If you have drug allergies check your prescription carefully.   For the next 24 hours you can use MyChart to ask questions about today's visit, request a non-urgent call back, or ask for a work or school excuse. You will get an email in the next two days asking about your experience. I hope that your e-visit has been valuable and will speed your recovery.

## 2022-08-23 NOTE — Progress Notes (Signed)
I have spent 5 minutes in review of e-visit questionnaire, review and updating patient chart, medical decision making and response to patient.  ° °Sarea Fyfe W Marlyn Tondreau, NP ° °  °

## 2022-08-26 ENCOUNTER — Ambulatory Visit: Payer: Self-pay | Admitting: Podiatry

## 2022-08-27 ENCOUNTER — Emergency Department (HOSPITAL_COMMUNITY)
Admission: EM | Admit: 2022-08-27 | Discharge: 2022-08-28 | Discharge status: Against Medical Advice | Payer: BLUE CROSS/BLUE SHIELD | Attending: Emergency Medicine | Admitting: Emergency Medicine

## 2022-08-27 ENCOUNTER — Encounter (HOSPITAL_COMMUNITY): Payer: Self-pay

## 2022-08-27 ENCOUNTER — Emergency Department (HOSPITAL_COMMUNITY): Payer: BLUE CROSS/BLUE SHIELD

## 2022-08-27 ENCOUNTER — Other Ambulatory Visit: Payer: Self-pay

## 2022-08-27 DIAGNOSIS — R209 Unspecified disturbances of skin sensation: Secondary | ICD-10-CM | POA: Insufficient documentation

## 2022-08-27 DIAGNOSIS — R29810 Facial weakness: Secondary | ICD-10-CM | POA: Insufficient documentation

## 2022-08-27 DIAGNOSIS — Z5321 Procedure and treatment not carried out due to patient leaving prior to being seen by health care provider: Secondary | ICD-10-CM | POA: Diagnosis not present

## 2022-08-27 LAB — COMPREHENSIVE METABOLIC PANEL
ALT: 18 U/L (ref 0–44)
AST: 29 U/L (ref 15–41)
Albumin: 4.1 g/dL (ref 3.5–5.0)
Alkaline Phosphatase: 79 U/L (ref 38–126)
Anion gap: 10 (ref 5–15)
BUN: 12 mg/dL (ref 6–20)
CO2: 26 mmol/L (ref 22–32)
Calcium: 9.5 mg/dL (ref 8.9–10.3)
Chloride: 101 mmol/L (ref 98–111)
Creatinine, Ser: 1.18 mg/dL (ref 0.61–1.24)
GFR, Estimated: >60 mL/min (ref >60)
Glucose, Bld: 87 mg/dL (ref 70–99)
Potassium: 4.6 mmol/L (ref 3.5–5.1)
Sodium: 137 mmol/L (ref 135–145)
Total Bilirubin: 0.8 mg/dL (ref 0.3–1.2)
Total Protein: 7.5 g/dL (ref 6.5–8.1)

## 2022-08-27 LAB — CBC WITH DIFFERENTIAL/PLATELET
Abs Immature Granulocytes: 0.16 10*3/uL — ABNORMAL HIGH (ref 0.00–0.07)
Basophils Absolute: 0.1 10*3/uL (ref 0.0–0.1)
Basophils Relative: 1 %
Eosinophils Absolute: 0.2 10*3/uL (ref 0.0–0.5)
Eosinophils Relative: 2 %
HCT: 41.7 % (ref 39.0–52.0)
Hemoglobin: 14.7 g/dL (ref 13.0–17.0)
Immature Granulocytes: 2 %
Lymphocytes Relative: 27 %
Lymphs Abs: 2.9 10*3/uL (ref 0.7–4.0)
MCH: 32 pg (ref 26.0–34.0)
MCHC: 35.3 g/dL (ref 30.0–36.0)
MCV: 90.7 fL (ref 80.0–100.0)
Monocytes Absolute: 0.5 10*3/uL (ref 0.1–1.0)
Monocytes Relative: 5 %
Neutro Abs: 6.8 10*3/uL (ref 1.7–7.7)
Neutrophils Relative %: 63 %
Platelets: 296 10*3/uL (ref 150–400)
RBC: 4.6 MIL/uL (ref 4.22–5.81)
RDW: 12.2 % (ref 11.5–15.5)
WBC: 10.7 10*3/uL — ABNORMAL HIGH (ref 4.0–10.5)
nRBC: 0 % (ref 0.0–0.2)

## 2022-08-27 LAB — TSH: TSH: 1.718 u[IU]/mL (ref 0.350–4.500)

## 2022-08-27 LAB — MAGNESIUM: Magnesium: 2.3 mg/dL (ref 1.7–2.4)

## 2022-08-27 NOTE — ED Triage Notes (Signed)
Pt brought by [facility] for right sided tongue numbness x 2 days after lidocaine injection by UC for mouth lesions. Pt developed difficulty blinking right eye since this A.M. No other neuro deficits present.

## 2022-08-27 NOTE — ED Provider Triage Note (Signed)
Emergency Medicine Provider Triage Evaluation Note  Billy Mann , a 42 y.o. male  was evaluated in triage.  Pt complains of right tongue numbness and difficulty closing the right eye. He was seen at an UC for ulcerations in the mouth and received a lidocaine injection in the right tongue for pain mgmt. Ulcers have resolved but numbness has continued along with feeling like he cannot fully close the right eye. Some face droop noted by EMS as well.   Review of Systems  Positive: Tongue numbness (right side), decreased taste, difficulty closing right eye and face droop.  Negative: Face numbness or arm/leg symptoms.   Physical Exam  BP (!) 154/104   Pulse 83   Temp 98.9 F (37.2 C) (Oral)   Resp 18   SpO2 100%  Gen:   Awake, no distress   Resp:  Normal effort  MSK:   Moves extremities without difficulty  Other:  Slight face droop to the right nasolabial fold. Weakness with closing the right eye. No arm/leg weakness. Speech is clear.   Medical Decision Making  Medically screening exam initiated at 7:54 PM.  Appropriate orders placed.  Rashaad Mazzanti was informed that the remainder of the evaluation will be completed by another provider, this initial triage assessment does not replace that evaluation, and the importance of remaining in the ED until their evaluation is complete.  Have placed orders for labs and CT head. Clinically seems most consistent with Bell's palsy. He is outside the code stroke window.    Maia Plan, MD 08/27/22 [phone]

## 2022-08-27 NOTE — ED Notes (Signed)
Pt decided to leave while waiting to go to a room.

## 2022-09-30 ENCOUNTER — Telehealth: Payer: BLUE CROSS/BLUE SHIELD | Admitting: Nurse Practitioner

## 2022-09-30 DIAGNOSIS — R300 Dysuria: Secondary | ICD-10-CM

## 2022-09-30 NOTE — Progress Notes (Signed)
E-Visit for Urinary Problems ? ?Based on what you shared with me, I feel your condition warrants further evaluation and I recommend that you be seen for a face to face office visit.  Male bladder infections are not very common.  We worry about prostate or kidney conditions.  The standard of care is to examine the abdomen and kidneys, and to do a urine and blood test to make sure that something more serious is not going on.  We recommend that you see a provider today.  If your doctor's office is closed [place] has the following Urgent Cares: ? ?  ?NOTE: You will not be charged for this e-visit. ? ?If you are having a true medical emergency please call 911.   ? ?  ? For an urgent face to face visit, [place] has six urgent care centers for your convenience:  ?  ? [facility] [place] ?Get Driving Directions ?[phone] ?[address] ?[place], [place] [address] ?  ? [facility] ([place]) ?Get Driving Directions ?[phone] ?[address] ?[place], [place] [address] ? ?[facility] ([place]) ?Get Driving Directions ?[phone] ?[address] ?[place],  [place]  [address] ? ?[facility] ?Get Driving Directions ?[phone] ?[address] ?[place], [place] [address] ?  ?[facility] [place] ?Get Driving Directions  ?[phone] ?[address] ?[place], [place] [address] ?  ? Urgent Care at [place] ?Get Driving Directions ?[phone] ?[address] ?[place],  [address] ? ?Your MyChart E-visit questionnaire answers were reviewed by a board certified advanced clinical practitioner to complete your personal care plan based on your specific symptoms.  Thank you for using e-Visits. ?

## 2022-10-08 ENCOUNTER — Telehealth: Payer: BLUE CROSS/BLUE SHIELD | Admitting: Physician Assistant

## 2022-10-08 DIAGNOSIS — M5430 Sciatica, unspecified side: Secondary | ICD-10-CM

## 2022-10-08 DIAGNOSIS — M549 Dorsalgia, unspecified: Secondary | ICD-10-CM | POA: Diagnosis not present

## 2022-10-08 MED ORDER — BACLOFEN 10 MG PO TABS
10.0000 mg | ORAL_TABLET | Freq: Three times a day (TID) | ORAL | 0 refills | Status: DC
Start: 2022-10-08 — End: 2023-04-16

## 2022-10-08 MED ORDER — GABAPENTIN 100 MG PO CAPS
100.0000 mg | ORAL_CAPSULE | Freq: Three times a day (TID) | ORAL | 0 refills | Status: DC
Start: 2022-10-08 — End: 2023-04-16

## 2022-10-08 MED ORDER — NAPROXEN 500 MG PO TABS
500.0000 mg | ORAL_TABLET | Freq: Two times a day (BID) | ORAL | 0 refills | Status: DC
Start: 2022-10-08 — End: 2023-12-08

## 2022-10-08 NOTE — Progress Notes (Signed)
We are sorry that you are not feeling well.  Here is how we plan to help!  Based on what you have shared with me it looks like you mostly have acute back pain.  Acute back pain is defined as musculoskeletal pain that can resolve in 1-3 weeks with conservative treatment.  I have prescribed Naprosyn 500 mg take one by mouth twice a day non-steroid anti-inflammatory (NSAID) as well as Baclofen 10 mg every eight hours as needed which is a muscle relaxer. I have also prescribed Gabapentin 100mg  Take 1 capsule every 8 hours as needed for nerve pain (burning).  Some patients experience stomach irritation or in increased heartburn with anti-inflammatory drugs.  Please keep in mind that muscle relaxer's can cause fatigue and should not be taken while at work or driving.  Back pain is very common.  The pain often gets better over time.  The cause of back pain is usually not dangerous.  Most people can learn to manage their back pain on their own.  Home Care Stay active.  Start with short walks on flat ground if you can.  Try to walk farther each day. Do not sit, drive or stand in one place for more than 30 minutes.  Do not stay in bed. Do not avoid exercise or work.  Activity can help your back heal faster. Be careful when you bend or lift an object.  Bend at your knees, keep the object close to you, and do not twist. Sleep on a firm mattress.  Lie on your side, and bend your knees.  If you lie on your back, put a pillow under your knees. Only take medicines as told by your doctor. Put ice on the injured area. Put ice in a plastic bag Place a towel between your skin and the bag Leave the ice on for 15-20 minutes, 3-4 times a day for the first 2-3 days. 210 After that, you can switch between ice and heat packs. Ask your doctor about back exercises or massage. Avoid feeling anxious or stressed.  Find good ways to deal with stress, such as exercise.  Get Help Right Way If: Your pain does not go away with  rest or medicine. Your pain does not go away in 1 week. You have new problems. You do not feel well. The pain spreads into your legs. You cannot control when you poop (bowel movement) or pee (urinate) You feel sick to your stomach (nauseous) or throw up (vomit) You have belly (abdominal) pain. You feel like you may pass out (faint). If you develop a fever.  Make Sure you: Understand these instructions. Will watch your condition Will get help right away if you are not doing well or get worse.  Your e-visit answers were reviewed by a board certified advanced clinical practitioner to complete your personal care plan.  Depending on the condition, your plan could have included both over the counter or prescription medications.  If there is a problem please reply  once you have received a response from your provider.  Your safety is important to [place].  If you have drug allergies check your prescription carefully.    You can use MyChart to ask questions about today's visit, request a non-urgent call back, or ask for a work or school excuse for 24 hours related to this e-Visit. If it has been greater than 24 hours you will need to follow up with your provider, or enter a new e-Visit to address those concerns.  You will get an e-mail in the next two days asking about your experience.  I hope that your e-visit has been valuable and will speed your recovery. Thank you for using e-visits.  I have spent 5 minutes in review of e-visit questionnaire, review and updating patient chart, medical decision making and response to patient.   Mar Daring, PA-C

## 2022-10-10 ENCOUNTER — Telehealth: Payer: BLUE CROSS/BLUE SHIELD | Admitting: Nurse Practitioner

## 2022-10-10 DIAGNOSIS — K0889 Other specified disorders of teeth and supporting structures: Secondary | ICD-10-CM

## 2022-10-10 MED ORDER — DOXYCYCLINE HYCLATE 100 MG PO TABS: 100.0000 mg | ORAL_TABLET | Freq: Two times a day (BID) | ORAL | 0 refills | Status: DC

## 2022-10-10 NOTE — Addendum Note (Signed)
Addended by: Bennie Pierini on: 10/10/2022 02:52 PM   Modules accepted: Orders

## 2022-10-10 NOTE — Progress Notes (Signed)

## 2022-10-14 ENCOUNTER — Other Ambulatory Visit (HOSPITAL_COMMUNITY): Payer: Self-pay

## 2022-10-14 ENCOUNTER — Telehealth: Payer: Self-pay

## 2022-10-14 NOTE — Telephone Encounter (Signed)
[facility] Patient Product/process development scientist completed.    The patient is insured through [employer] and has a 0.00 copay.  We will continue to follow to see if copay assistance is needed.  Clearance Coots, CPhT Specialty Pharmacy Patient [facility] for Infectious Disease Phone: [phone] Fax:  [phone]

## 2022-10-15 ENCOUNTER — Ambulatory Visit: Payer: BLUE CROSS/BLUE SHIELD | Admitting: Pharmacist

## 2022-11-20 ENCOUNTER — Telehealth: Payer: BLUE CROSS/BLUE SHIELD | Admitting: Physician Assistant

## 2022-11-20 DIAGNOSIS — K047 Periapical abscess without sinus: Secondary | ICD-10-CM | POA: Diagnosis not present

## 2022-11-20 MED ORDER — CLINDAMYCIN HCL 300 MG PO CAPS
300.0000 mg | ORAL_CAPSULE | Freq: Three times a day (TID) | ORAL | 0 refills | Status: AC
Start: 2022-11-20 — End: 2022-11-27

## 2022-11-20 NOTE — Progress Notes (Signed)

## 2023-01-03 ENCOUNTER — Telehealth: Payer: BLUE CROSS/BLUE SHIELD | Admitting: Nurse Practitioner

## 2023-01-03 DIAGNOSIS — K219 Gastro-esophageal reflux disease without esophagitis: Secondary | ICD-10-CM | POA: Diagnosis not present

## 2023-01-03 MED ORDER — PANTOPRAZOLE SODIUM 20 MG PO TBEC: 20.0000 mg | DELAYED_RELEASE_TABLET | Freq: Every day | ORAL | 0 refills | Status: DC

## 2023-01-03 NOTE — Progress Notes (Signed)
I have spent 5 minutes in review of e-visit questionnaire, review and updating patient chart, medical decision making and response to patient.  ° °Jerrell Mangel W Secilia Apps, NP ° °  °

## 2023-01-03 NOTE — Progress Notes (Signed)
Your pantroprazole has been filled. Your insurance if with [facility]. You should be able to schedule with any [facility] provider for primary care for further refills of your pantroprazole and to establish care.   E-Visit for Heartburn  We are sorry that you are not feeling well.  Here is how we plan to help!  Based on what you shared with me it looks like you most likely have Gastroesophageal Reflux Disease (GERD)  Gastroesophageal reflux disease (GERD) happens when acid from your stomach flows up into the esophagus.  When acid comes in contact with the esophagus, the acid causes sorenss (inflammation) in the esophagus.  Over time, GERD may create small holes (ulcers) in the lining of the esophagus.  Your symptoms should improve in the next day or two.  You can use antacids as needed until symptoms resolve.  Call us if your heartburn worsens, you have trouble swallowing, weight loss, spitting up blood or recurrent vomiting.  Home Care: May include lifestyle changes such as weight loss, quitting smoking and alcohol consumption Avoid foods and drinks that make your symptoms worse, such as: Caffeine or alcoholic drinks Chocolate Peppermint or mint flavorings Garlic and onions Spicy foods Citrus fruits, such as oranges, lemons, or limes Tomato-based foods such as sauce, chili, salsa and pizza Fried and fatty foods Avoid lying down for 3 hours prior to your bedtime or prior to taking a nap Eat small, frequent meals instead of a large meals Wear loose-fitting clothing.  Do not wear anything tight around your waist that causes pressure on your stomach. Raise the head of your bed 6 to 8 inches with wood blocks to help you sleep.  Extra pillows will not help.  Seek Help Right Away If: You have pain in your arms, neck, jaw, teeth or back Your pain increases or changes in intensity or duration You develop nausea, vomiting or sweating (diaphoresis) You develop shortness of  breath or you faint Your vomit is green, yellow, black or looks like coffee grounds or blood Your stool is red, bloody or black  These symptoms could be signs of other problems, such as heart disease, gastric bleeding or esophageal bleeding.  Make sure you : Understand these instructions. Will watch your condition. Will get help right away if you are not doing well or get worse.  Your e-visit answers were reviewed by a board certified advanced clinical practitioner to complete your personal care plan.  Depending on the condition, your plan could have included both over the counter or prescription medications.  If there is a problem please reply  once you have received a response from your provider.  Your safety is important to [place].  If you have drug allergies check your prescription carefully.    You can use MyChart to ask questions about today's visit, request a non-urgent call back, or ask for a work or school excuse for 24 hours related to this e-Visit. If it has been greater than 24 hours you will need to follow up with your provider, or enter a new e-Visit to address those concerns.  You will get an e-mail in the next two days asking about your experience.  I hope that your e-visit has been valuable and will speed your recovery. Thank you for using e-visits.

## 2023-01-05 ENCOUNTER — Telehealth: Payer: Self-pay

## 2023-01-05 ENCOUNTER — Other Ambulatory Visit: Payer: Self-pay

## 2023-01-05 NOTE — Telephone Encounter (Signed)
Pharmacy Patient Advocate Encounter  Received notification from [facility] that Prior Authorization for PANTOPRAZOLE has been APPROVED from 01/05/2023 to 01/05/2024   PA #/Case ID/Reference #: [id number]

## 2023-01-25 ENCOUNTER — Telehealth: Payer: BLUE CROSS/BLUE SHIELD | Admitting: Physician Assistant

## 2023-01-25 DIAGNOSIS — K047 Periapical abscess without sinus: Secondary | ICD-10-CM

## 2023-01-25 MED ORDER — IBUPROFEN 600 MG PO TABS
600.0000 mg | ORAL_TABLET | Freq: Three times a day (TID) | ORAL | 0 refills | Status: DC | PRN
Start: 2023-01-25 — End: 2023-12-08

## 2023-01-25 MED ORDER — CLINDAMYCIN HCL 300 MG PO CAPS
300.0000 mg | ORAL_CAPSULE | Freq: Three times a day (TID) | ORAL | 0 refills | Status: AC
Start: 2023-01-25 — End: 2023-02-01

## 2023-01-25 NOTE — Progress Notes (Signed)
E-Visit for Dental Pain  We are sorry that you are not feeling well.  Here is how we plan to help!  Based on what you have shared with me in the questionnaire, it sounds like you have a dental infection.  Clindamycin 300mg  3 times a day for 7 days and Ibuprofen 600mg  3 times a day for 7 days for discomfort  It is imperative that you see a dentist within 10 days of this eVisit to determine the cause of the dental pain and be sure it is adequately treated  A toothache or tooth pain is caused when the nerve in the root of a tooth or surrounding a tooth is irritated. Dental (tooth) infection, decay, injury, or loss of a tooth are the most common causes of dental pain. Pain may also occur after an extraction (tooth is pulled out). Pain sometimes originates from other areas and radiates to the jaw, thus appearing to be tooth pain.Bacteria growing inside your mouth can contribute to gum disease and dental decay, both of which can cause pain. A toothache occurs from inflammation of the central portion of the tooth called pulp. The pulp contains nerve endings that are very sensitive to pain. Inflammation to the pulp or pulpitis may be caused by dental cavities, trauma, and infection.    HOME CARE:   For toothaches: Over-the-counter pain medications such as acetaminophen or ibuprofen may be used. Take these as directed on the package while you arrange for a dental appointment. Avoid very cold or hot foods, because they may make the pain worse. You may get relief from biting on a cotton ball soaked in oil of cloves. You can get oil of cloves at most drug stores.  For jaw pain:  Aspirin may be helpful for problems in the joint of the jaw in adults. If pain happens every time you open your mouth widely, the temporomandibular joint (TMJ) may be the source of the pain. Yawning or taking a large bite of food may worsen the pain. An appointment with your doctor or dentist will help you find the cause.      GET HELP RIGHT AWAY IF:  You have a high fever or chills If you have had a recent head or face injury and develop headache, light headedness, nausea, vomiting, or other symptoms that concern you after an injury to your face or mouth, you could have a more serious injury in addition to your dental injury. A facial rash associated with a toothache: This condition may improve with medication. Contact your doctor for them to decide what is appropriate. Any jaw pain occurring with chest pain: Although jaw pain is most commonly caused by dental disease, it is sometimes referred pain from other areas. People with heart disease, especially people who have had stents placed, people with diabetes, or those who have had heart surgery may have jaw pain as a symptom of heart attack or angina. If your jaw or tooth pain is associated with lightheadedness, sweating, or shortness of breath, you should see a doctor as soon as possible. Trouble swallowing or excessive pain or bleeding from gums: If you have a history of a weakened immune system, diabetes, or steroid use, you may be more susceptible to infections. Infections can often be more severe and extensive or caused by unusual organisms. Dental and gum infections in people with these conditions may require more aggressive treatment. An abscess may need draining or IV antibiotics, for example.  MAKE SURE YOU   Understand these  instructions. Will watch your condition. Will get help right away if you are not doing well or get worse.  Thank you for choosing an e-visit.  Your e-visit answers were reviewed by a board certified advanced clinical practitioner to complete your personal care plan. Depending upon the condition, your plan could have included both over the counter or prescription medications.  Please review your pharmacy choice. Make sure the pharmacy is open so you can pick up prescription now. If there is a problem, you may contact your provider  through [employer] and have the prescription routed to another pharmacy.  Your safety is important to [place]. If you have drug allergies check your prescription carefully.   For the next 24 hours you can use MyChart to ask questions about today's visit, request a non-urgent call back, or ask for a work or school excuse. You will get an email in the next two days asking about your experience. I hope that your e-visit has been valuable and will speed your recovery.  I have spent 5 minutes in review of e-visit questionnaire, review and updating patient chart, medical decision making and response to patient.   Margaretann Loveless, PA-C

## 2023-02-17 ENCOUNTER — Telehealth: Payer: BLUE CROSS/BLUE SHIELD | Admitting: Physician Assistant

## 2023-02-17 DIAGNOSIS — N5089 Other specified disorders of the male genital organs: Secondary | ICD-10-CM

## 2023-02-17 NOTE — Progress Notes (Signed)
Message sent to patient requesting further input regarding current symptoms. Awaiting patient response.  

## 2023-02-17 NOTE — Progress Notes (Signed)
Thank you for letting me know. I honestly would recommend an in-person evaluation to make sure the proper diagnosis is made and treatment given as a culture of that area would be very helpful. I question folliculitis from shaving but the area in the picture seems ulcerated which is not common with a folliculitis and more common with viral or atypical infections.   Since we have recommended that you be seen in person, you are not charged for this E-visit.  NEW!! [facility] Health Urgent Care Center at [facility] Get Driving Directions [phone] [address] [place], [address]    [facility] Health Urgent Care Center at [facility] Get Driving Directions [phone] [address] [place], [place] [address]   [facility] Health Urgent Care Center [facility]) Get Driving Directions [phone] [address] [place], [place] [address]  [facility] Health Urgent Care Center [facility]) Get Driving Directions [phone] [address] [place],  [place]  [address]  [facility] [place] [facility] Health Urgent Care Center [facility] - at [employer]  [phone] [address] [place],  [place] [address]   [facility]gent Care at [facility] Get Driving Directions [phone] [address] [place], [place] [address]   [facility] Health Urgent Care at [facility] Get Driving Directions  [phone] [address] [place], [place] [address]   [facility] Health Urgent Care at [facility] Directions [phone] [address] [place], [place] [address]

## 2023-02-24 ENCOUNTER — Telehealth: Payer: BLUE CROSS/BLUE SHIELD | Admitting: Physician Assistant

## 2023-02-24 DIAGNOSIS — R1013 Epigastric pain: Secondary | ICD-10-CM

## 2023-02-24 DIAGNOSIS — R0789 Other chest pain: Secondary | ICD-10-CM

## 2023-02-24 DIAGNOSIS — R1310 Dysphagia, unspecified: Secondary | ICD-10-CM

## 2023-02-24 NOTE — Progress Notes (Signed)
Because of recent e-visit for heartburn at end of September with ongoing symptoms and alarm symptoms -- chest pain, wheezing, difficulty swallowing -- I feel your condition warrants further evaluation and I recommend that you be seen in a face to face visit.   NOTE: There will be NO CHARGE for this eVisit   If you are having a true medical emergency please call 911.      For an urgent face to face visit, [facility] has eight urgent care centers for your convenience:   NEW!! [facility] Health Urgent Care Center at [facility] Get Driving Directions [phone] [address] [place], [address]    [facility] Health Urgent Care Center at [facility] Get Driving Directions [phone] [address] [place], [place] [address]   [facility] [place] [facility] Health Urgent Care Center [facility]) Get Driving Directions [phone] [address] [place], [place] [address]  [facility] Health Urgent Care Center [facility]) Get Driving Directions [phone] [address] [place],  [place]  [address]  [facility] Health Urgent Care Center [facility] - at [employer]  [phone] [address] [place],  [place] [address]   [facility] Health Urgent Care at [facility] [place] Get Driving Directions [phone] [address] [place], [place] [address]   [facility] Health Urgent Care at [facility] Get Driving Directions  [phone] [address] [place], [place] [address]   [facility] Health Urgent Care at [facility] Directions [phone] [address] [place], [place] [address]  Your MyChart E-visit questionnaire answers were reviewed by a board certified advanced clinical practitioner to complete your personal care plan based on your specific symptoms.  Thank you for using e-Visits.

## 2023-03-30 ENCOUNTER — Telehealth: Payer: BLUE CROSS/BLUE SHIELD

## 2023-04-16 ENCOUNTER — Telehealth: Payer: BLUE CROSS/BLUE SHIELD | Admitting: Physician Assistant

## 2023-04-16 DIAGNOSIS — R6889 Other general symptoms and signs: Secondary | ICD-10-CM | POA: Diagnosis not present

## 2023-04-16 MED ORDER — OSELTAMIVIR PHOSPHATE 75 MG PO CAPS
75.0000 mg | ORAL_CAPSULE | Freq: Two times a day (BID) | ORAL | 0 refills | Status: DC
Start: 2023-04-16 — End: 2023-12-08

## 2023-04-16 NOTE — Progress Notes (Signed)
E visit for Flu like symptoms   We are sorry that you are not feeling well.  Here is how we plan to help! Based on what you have shared with me it looks like you may have a respiratory virus that may be influenza.  Influenza or "the flu" is   an infection caused by a respiratory virus. The flu virus is highly contagious and persons who did not receive their yearly flu vaccination may "catch" the flu from close contact.  We have anti-viral medications to treat the viruses that cause this infection. They are not a "cure" and only shorten the course of the infection. These prescriptions are most effective when they are given within the first 2 days of "flu" symptoms. Antiviral medication are indicated if you have a high risk of complications from the flu. You should  also consider an antiviral medication if you are in close contact with someone who is at risk. These medications can help patients avoid complications from the flu  but have side effects that you should know. Possible side effects from Tamiflu or oseltamivir include nausea, vomiting, diarrhea, dizziness, headaches, eye redness, sleep problems or other respiratory symptoms. You should not take Tamiflu if you have an allergy to oseltamivir or any to the ingredients in Tamiflu.  Based upon your symptoms and potential risk factors I have prescribed Oseltamivir (Tamiflu).  It has been sent to your designated pharmacy.  You will take one 75 mg capsule orally twice a day for the next 5 days.  ANYONE WHO HAS FLU SYMPTOMS SHOULD: Stay home. The flu is highly contagious and going out or to work exposes others! Be sure to drink plenty of fluids. Water is fine as well as fruit juices, sodas and electrolyte beverages. You may want to stay away from caffeine or alcohol. If you are nauseated, try taking small sips of liquids. How do you know if you are getting enough fluid? Your urine should be a pale yellow or almost colorless. Get rest. Taking a steamy  shower or using a humidifier may help nasal congestion and ease sore throat pain. Using a saline nasal spray works much the same way. Cough drops, hard candies and sore throat lozenges may ease your cough. Line up a caregiver. Have someone check on you regularly.   GET HELP RIGHT AWAY IF: You cannot keep down liquids or your medications. You become short of breath Your fell like you are going to pass out or loose consciousness. Your symptoms persist after you have completed your treatment plan MAKE SURE YOU  Understand these instructions. Will watch your condition. Will get help right away if you are not doing well or get worse.  Your e-visit answers were reviewed by a board certified advanced clinical practitioner to complete your personal care plan.  Depending on the condition, your plan could have included both over the counter or prescription medications.  If there is a problem please reply  once you have received a response from your provider.  Your safety is important to us.  If you have drug allergies check your prescription carefully.    You can use MyChart to ask questions about today's visit, request a non-urgent call back, or ask for a work or school excuse for 24 hours related to this e-Visit. If it has been greater than 24 hours you will need to follow up with your provider, or enter a new e-Visit to address those concerns.  You will get an e-mail in the next   two days asking about your experience.  I hope that your e-visit has been valuable and will speed your recovery. Thank you for using e-visits.  I have spent 5 minutes in review of e-visit questionnaire, review and updating patient chart, medical decision making and response to patient.   Meryl Hubers M Amadi Frady, PA-C  

## 2023-05-13 ENCOUNTER — Telehealth: Payer: BLUE CROSS/BLUE SHIELD | Admitting: Family Medicine

## 2023-05-13 DIAGNOSIS — K219 Gastro-esophageal reflux disease without esophagitis: Secondary | ICD-10-CM

## 2023-05-13 DIAGNOSIS — R1310 Dysphagia, unspecified: Secondary | ICD-10-CM

## 2023-05-13 DIAGNOSIS — R1013 Epigastric pain: Secondary | ICD-10-CM

## 2023-05-13 NOTE — Progress Notes (Signed)
  Because you have been provided with several refills of these medications since Sept of 2024, you need to have you followed up in person. We do not management chronic conditions.  Your condition warrants further evaluation and I recommend that you be seen in a face-to-face visit at an office like a PCP and or local urgent care.    NOTE: There will be NO CHARGE for this E-Visit   If you are having a true medical emergency, please call 911.

## 2023-09-24 ENCOUNTER — Telehealth: Payer: Self-pay | Admitting: Family Medicine

## 2023-09-24 DIAGNOSIS — J4530 Mild persistent asthma, uncomplicated: Secondary | ICD-10-CM

## 2023-09-24 MED ORDER — ALBUTEROL SULFATE HFA 108 (90 BASE) MCG/ACT IN AERS: 2.0000 | INHALATION_SPRAY | Freq: Four times a day (QID) | RESPIRATORY_TRACT | 0 refills | Status: AC | PRN

## 2023-09-24 NOTE — Progress Notes (Signed)

## 2023-11-03 ENCOUNTER — Ambulatory Visit: Payer: MEDICAID | Admitting: Podiatry

## 2023-11-10 ENCOUNTER — Ambulatory Visit: Payer: MEDICAID | Admitting: Podiatry

## 2023-11-29 ENCOUNTER — Ambulatory Visit: Payer: MEDICAID | Admitting: Podiatry

## 2023-12-08 ENCOUNTER — Telehealth: Payer: MEDICAID | Admitting: Physician Assistant

## 2023-12-08 DIAGNOSIS — B079 Viral wart, unspecified: Secondary | ICD-10-CM

## 2023-12-08 NOTE — Progress Notes (Signed)
  Because of the location of the wart, I feel your condition warrants further evaluation and I recommend that you be seen in a face-to-face visit. You may benefit more from possible removal or cryotherapy instead of topical treatment based on the location close to the mouth.    NOTE: There will be NO CHARGE for this E-Visit   If you are having a true medical emergency, please call 911.     For an urgent face to face visit, [facility] has multiple urgent care centers for your convenience.  Click the link below for the full list of locations and hours, walk-in wait times, appointment scheduling options and driving directions:  Urgent Care - [place], [place], [place], [place], [place], [place]  [place]     Your MyChart E-visit questionnaire answers were reviewed by a board certified advanced clinical practitioner to complete your personal care plan based on your specific symptoms.    Thank you for using e-Visits.    I have spent 5 minutes in review of e-visit questionnaire, review and updating patient chart, medical decision making and response to patient.   Delon CHRISTELLA Dickinson, PA-C

## 2023-12-09 ENCOUNTER — Ambulatory Visit
Admission: RE | Admit: 2023-12-09 | Discharge: 2023-12-09 | Disposition: A | Discharge status: Home / Self Care | Payer: MEDICAID | Source: Ambulatory Visit | Attending: Family Medicine | Admitting: Family Medicine

## 2023-12-09 VITALS — BP 145/86 | HR 82 | Temp 98.8°F | Resp 16

## 2023-12-09 DIAGNOSIS — L989 Disorder of the skin and subcutaneous tissue, unspecified: Secondary | ICD-10-CM | POA: Diagnosis not present

## 2023-12-09 NOTE — ED Provider Notes (Signed)
 [facility]  Note:  This document was prepared using Conservation officer, historic buildings and may include unintentional dictation errors.  MRN: [id number] DOB: 25-Nov-1980  Subjective:   Billy Mann is a 43 y.o. male presenting for 2 month history of a persistent lesion over lower part of his face.  No tenderness, drainage of pus or bleeding.  It is uncomfortable because of the location right under his lip and frequent contact to the area.  No current facility-administered medications for this encounter.  Current Outpatient Medications:    DESCOVY 200-25 MG tablet, SMARTSIG:1 Tablet(s) By Mouth, Disp: , Rfl:    albuterol  (VENTOLIN  HFA) 108 (90 Base) MCG/ACT inhaler, Inhale 2 puffs into the lungs every 6 (six) hours as needed for wheezing or shortness of breath., Disp: 8 g, Rfl: 0   atorvastatin  (LIPITOR) 20 MG tablet, Take 1 tablet (20 mg total) by mouth daily., Disp: 90 tablet, Rfl: 1   famotidine  (PEPCID ) 40 MG tablet, Take 1 tablet by mouth once daily, Disp: 30 tablet, Rfl: 6   hydrochlorothiazide  (HYDRODIURIL ) 25 MG tablet, Take 1 tablet (25 mg total) by mouth daily., Disp: 90 tablet, Rfl: 1   pantoprazole  (PROTONIX ) 20 MG tablet, Take 1 tablet (20 mg total) by mouth daily., Disp: 30 tablet, Rfl: 0   sildenafil  (VIAGRA ) 100 MG tablet, TAKE 1/2 TO 1 (ONE-HALF TO ONE) TABLET BY MOUTH ONCE DAILY AS NEEDED FOR ERECTILE DYSFUNCTION, Disp: 6 tablet, Rfl: 0   Allergies  Allergen Reactions   Penicillins Anaphylaxis    Past Medical History:  Diagnosis Date   Anxiety    Phreesia 03/30/2020   Anxiety    Depression    Phreesia 03/30/2020   Depression    GERD (gastroesophageal reflux disease)    Phreesia 03/30/2020   High cholesterol    Hyperlipidemia    Phreesia 03/30/2020   Hypertension    Phreesia 03/30/2020   Sleep apnea    Phreesia 03/30/2020     Past Surgical History:  Procedure Laterality Date   FOOT SURGERY     bilateral hammer toe    TONSILLECTOMY      Family History  Problem Relation Age of Onset   Heart disease Mother    Heart disease Father    Diabetes Paternal Grandmother     Social History   Tobacco Use   Smoking status: Every Day    Types: Cigarettes   Smokeless tobacco: Never  Vaping Use   Vaping status: Never Used  Substance Use Topics   Alcohol use: Never   Drug use: Yes    Types: Methamphetamines    ROS   Objective:   Vitals: BP (!) 145/86 (BP Location: Right Arm)   Pulse 82   Temp 98.8 F (37.1 C) (Oral)   Resp 16   SpO2 97%   Physical Exam Constitutional:      General: He is not in acute distress.    Appearance: Normal appearance. He is well-developed and normal weight. He is not ill-appearing, toxic-appearing or diaphoretic.  HENT:     Head: Normocephalic and atraumatic.     Right Ear: External ear normal.     Left Ear: External ear normal.     Nose: Nose normal.     Mouth/Throat:     Pharynx: Oropharynx is clear.   Eyes:     General: No scleral icterus.       Right eye: No discharge.        Left eye: No discharge.  Extraocular Movements: Extraocular movements intact.  Cardiovascular:     Rate and Rhythm: Normal rate.  Pulmonary:     Effort: Pulmonary effort is normal.  Musculoskeletal:     Cervical back: Normal range of motion.  Neurological:     Mental Status: He is alert and oriented to person, place, and time.  Psychiatric:        Mood and Affect: Mood normal.        Behavior: Behavior normal.        Thought Content: Thought content normal.        Judgment: Judgment normal.       Assessment and Plan :   PDMP not reviewed this encounter.  1. Lesion of skin of face    Suspect pyogenic granuloma, wart, warrants consultation otologist and consideration for procedural removal, biopsy.  Counseled patient on potential for adverse effects with medications prescribed/recommended today, ER and return-to-clinic precautions discussed, patient verbalized  understanding.    Christopher Savannah, [place] 12/09/23 1751

## 2023-12-09 NOTE — ED Triage Notes (Addendum)
 Pt reports a painful bump under bottom lip x 2 months  Its embarrassing OTC meds no relief. Reports virtual visit suggested pt for evaluation in person.

## 2023-12-09 NOTE — Discharge Instructions (Signed)
 Please follow up with a dermatology practice for a consultation and consideration for procedural removal.

## 2024-01-02 ENCOUNTER — Telehealth: Payer: MEDICAID | Admitting: Physician Assistant

## 2024-01-02 DIAGNOSIS — L0291 Cutaneous abscess, unspecified: Secondary | ICD-10-CM | POA: Diagnosis not present

## 2024-01-03 MED ORDER — SULFAMETHOXAZOLE-TRIMETHOPRIM 800-160 MG PO TABS
1.0000 | ORAL_TABLET | Freq: Two times a day (BID) | ORAL | 0 refills | Status: DC
Start: 2024-01-03 — End: 2024-02-16

## 2024-01-03 NOTE — Progress Notes (Signed)

## 2024-01-18 ENCOUNTER — Telehealth: Payer: MEDICAID

## 2024-02-02 ENCOUNTER — Ambulatory Visit: Payer: MEDICAID | Admitting: Podiatry

## 2024-02-16 ENCOUNTER — Telehealth: Payer: MEDICAID | Admitting: Physician Assistant

## 2024-02-16 DIAGNOSIS — L731 Pseudofolliculitis barbae: Secondary | ICD-10-CM

## 2024-02-16 NOTE — Progress Notes (Signed)
 E Visit for Rash  We are sorry that you are not feeling well. Here is how we plan to help!  At this current time it appears you may have an ingrown hair.  The area appears mild and may improve with conservative management. I would recommend to do warm compresses over the area. If you have a bath tub you can also do Epsom Salt soaks with warm water to help with pain and inflammation.   You can take Tylenol  and/or Ibuprofen  as needed for pain and inflammation. These can be alternated every 4 hours as needed.    HOME CARE:  Take cool showers and avoid direct sunlight. Apply cool compress or wet dressings. Take a bath in an oatmeal bath.  Sprinkle content of one Aveeno packet under running faucet with comfortably warm water.  Bathe for 15-20 minutes, 1-2 times daily.  Pat dry with a towel. Do not rub the rash. Use hydrocortisone cream. Take an antihistamine like Benadryl for widespread rashes that itch.  The adult dose of Benadryl is 25-50 mg by mouth 4 times daily. Caution:  This type of medication may cause sleepiness.  Do not drink alcohol, drive, or operate dangerous machinery while taking antihistamines.  Do not take these medications if you have prostate enlargement.  Read package instructions thoroughly on all medications that you take.  GET HELP RIGHT AWAY IF:  Symptoms don't go away after treatment. Severe itching that persists. If you rash spreads or swells. If you rash begins to smell. If it blisters and opens or develops a yellow-brown crust. You develop a fever. You have a sore throat. You become short of breath.  MAKE SURE YOU:  Understand these instructions. Will watch your condition. Will get help right away if you are not doing well or get worse.  Thank you for choosing an e-visit. Your e-visit answers were reviewed by a board certified advanced clinical practitioner to complete your personal care plan. Depending upon the condition, your plan could have included  both over the counter or prescription medications. Please review your pharmacy choice. Be sure that the pharmacy you have chosen is open so that you can pick up your prescription now.  If there is a problem you may message your provider in MyChart to have the prescription routed to another pharmacy. Your safety is important to us . If you have drug allergies check your prescription carefully.  For the next 24 hours, you can use MyChart to ask questions about today's visit, request a non-urgent call back, or ask for a work or school excuse from your e-visit provider. You will get an email in the next two days asking about your experience. I hope that your e-visit has been valuable and will speed your recovery.  I have spent 5 minutes in review of e-visit questionnaire, review and updating patient chart, medical decision making and response to patient.   Delon CHRISTELLA Dickinson, PA-C

## 2024-03-13 ENCOUNTER — Ambulatory Visit
Admission: RE | Admit: 2024-03-13 | Discharge: 2024-03-13 | Disposition: A | Discharge status: Home / Self Care | Payer: MEDICAID | Attending: Physician Assistant | Admitting: Physician Assistant

## 2024-03-13 ENCOUNTER — Other Ambulatory Visit: Payer: Self-pay

## 2024-03-13 VITALS — BP 138/100 | HR 86 | Temp 98.7°F | Resp 16 | Ht 70.0 in | Wt 160.0 lb

## 2024-03-13 DIAGNOSIS — R300 Dysuria: Secondary | ICD-10-CM | POA: Insufficient documentation

## 2024-03-13 DIAGNOSIS — Z113 Encounter for screening for infections with a predominantly sexual mode of transmission: Secondary | ICD-10-CM | POA: Insufficient documentation

## 2024-03-13 LAB — POCT URINE DIPSTICK
Glucose, UA: NEGATIVE mg/dL
Nitrite, UA: POSITIVE — AB
POC PROTEIN,UA: 30 — AB
Spec Grav, UA: >=1.03 — AB (ref 1.010–1.025)
Urobilinogen, UA: 1 U/dL
pH, UA: 5.5 (ref 5.0–8.0)

## 2024-03-13 MED ORDER — SULFAMETHOXAZOLE-TRIMETHOPRIM 800-160 MG PO TABS: 1.0000 | ORAL_TABLET | Freq: Two times a day (BID) | ORAL | 0 refills | Status: AC

## 2024-03-13 NOTE — Discharge Instructions (Addendum)
 VISIT SUMMARY:  You came in today with symptoms of a urinary tract infection (UTI), including frequent urination, mild discomfort, and cloudy urine. You have a history of UTIs and sexually transmitted infections (STIs), but your current symptoms are different from those of STIs. You do not have any fever, chills, or other concerning symptoms.  YOUR PLAN:  -URINARY TRACT INFECTION: A urinary tract infection (UTI) is an infection in any part of your urinary system. Your symptoms and urinalysis results support this diagnosis. You have been prescribed Bactrim  to take twice daily for 7 days. A urine culture has been sent to identify the bacteria causing the infection and to ensure the correct antibiotics are used. Swabs have also been sent to test for STIs like gonorrhea, chlamydia, and trichomonas. Please refrain from sexual activity until your test results are negative or your treatment is completed. Follow up if you need an additional shot. If you experience recurrent UTIs, a consultation with your primary care doctor and a urologist is recommended.  INSTRUCTIONS:  Please take Bactrim  as prescribed, twice daily for 7 days. Refrain from sexual activity until your test results are negative or your treatment is completed. Follow up if you need an additional shot. If you experience recurrent UTIs, consult with your primary care doctor and a urologist.

## 2024-03-13 NOTE — ED Triage Notes (Addendum)
 Pt presents to urgent care for STD testing. States he experienced white pus/penile discharge once last night. Does endorse mild burning with urination. Currently denies pain. BP is 138/100 in triage. States he usually takes his atorvastatin  for this however it has not been refilled in months.

## 2024-03-13 NOTE — ED Provider Notes (Signed)
 [facility]    CSN: [id number] Arrival date & time: 03/13/24  1854      History   Chief Complaint Chief Complaint  Patient presents with   SEXUALLY TRANSMITTED DISEASE    Entered by patient   Urinary Frequency    X 2 days.    HPI Billy Mann is a 43 y.o. male.  has a past medical history of Anxiety, Anxiety, Depression, Depression, GERD (gastroesophageal reflux disease), High cholesterol, Hyperlipidemia, Hypertension, and Sleep apnea.   HPI  Discussed the use of AI scribe software for clinical note transcription with the patient, who gave verbal consent to proceed.  The patient presents with urinary symptoms suggestive of a urinary tract infection.  He has been experiencing urinary symptoms for the past two to three days, including frequent urination with only small amounts of urine each time. Urination is accompanied by mild discomfort, though not the burning sensation typically associated with sexually transmitted infections. His urine appears cloudy.  He has a history of a previous urinary tract infection and past experiences with sexually transmitted infections, but states that his current symptoms differ from those he experienced with STIs. No fever, chills, back pain, abdominal pain, rashes, sores in the genital area, or swelling of the penis or testicles. He reports occasional difficulty with urination, such as needing to push or strain.  He mentions feeling extremely tired today and notes that he stayed in bed until 4 PM.   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/06/2021   On pre-exposure prophylaxis for HIV 02/06/2021   GERD (gastroesophageal reflux disease) 06/10/2020   Diarrhea 06/10/2020   Mixed hyperlipidemia 04/09/2020   Attention deficit hyperactivity disorder (ADHD), 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   Generalized anxiety disorder 02/19/2020   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 Authorizing Provider  sulfamethoxazole -trimethoprim  (BACTRIM  DS) 800-160 MG tablet Take 1 tablet by mouth 2 (two) times daily for 7 days. 03/13/24 03/20/24 Yes Angla Delahunt E, PA-C  albuterol  (VENTOLIN  HFA) 108 (90 Base) MCG/ACT inhaler Inhale 2 puffs into the lungs every 6 (six) hours as needed for wheezing or shortness of breath. 09/24/23   Blair, Diane W, FNP  atorvastatin  (LIPITOR) 20 MG tablet Take 1 tablet (20 mg total) by mouth daily. 08/14/21   Tanda Bleacher, MD  DESCOVY 200-25 MG tablet SMARTSIG:1 Tablet(s) By Mouth 09/15/23   [provider]  famotidine  (PEPCID ) 40 MG tablet Take 1 tablet by mouth once daily 12/02/21   Kennedy-Smith, Colleen M, NP  hydrochlorothiazide  (HYDRODIURIL ) 25 MG tablet  Take 1 tablet (25 mg total) by mouth daily. 08/14/21   Tanda Bleacher, MD  pantoprazole  (PROTONIX ) 20 MG tablet Take 1 tablet (20 mg total) by mouth daily. 01/03/23   Theotis Haze ORN, NP  sildenafil  (VIAGRA ) 100 MG tablet TAKE 1/2 TO 1 (ONE-HALF TO ONE) TABLET BY MOUTH ONCE DAILY AS NEEDED FOR ERECTILE DYSFUNCTION 06/10/22   Tanda Bleacher, MD    Family History Family History   Problem Relation Age of Onset   Heart disease Mother    Heart disease Father    Diabetes Paternal Grandmother     Social History Social History   Tobacco Use   Smoking status: Every Day    Types: Cigarettes   Smokeless tobacco: Never  Vaping Use   Vaping status: Never Used  Substance Use Topics   Alcohol use: Never   Drug use: Yes    Types: Methamphetamines     Allergies   Penicillins   Review of Systems Review of Systems  Constitutional:  Positive for fatigue. Negative for chills and fever.  Gastrointestinal:  Negative for abdominal pain.  Genitourinary:  Positive for decreased urine volume, difficulty urinating, dysuria, frequency, penile discharge and urgency. Negative for flank pain, genital sores, penile pain, penile swelling, scrotal swelling and testicular pain.  Skin:  Negative for rash.     Physical Exam Triage Vital Signs ED Triage Vitals  Encounter Vitals Group     BP 03/13/24 1927 (!) 138/100     Girls Systolic BP Percentile --      Girls Diastolic BP Percentile --      Boys Systolic BP Percentile --      Boys Diastolic BP Percentile --      Pulse Rate 03/13/24 1927 86     Resp 03/13/24 1927 16     Temp 03/13/24 1927 98.7 F (37.1 C)     Temp Source 03/13/24 1927 Oral     SpO2 03/13/24 1927 98 %     Weight 03/13/24 1927 160 lb (72.6 kg)     Height 03/13/24 1927 5' 10 (1.778 m)     Head Circumference --      Peak Flow --      Pain Score 03/13/24 1952 0     Pain Loc --      Pain Education --      Exclude from Growth Chart --    No data found.  Updated Vital Signs BP (!) 138/100 (BP Location: Right Arm)   Pulse 86   Temp 98.7 F (37.1 C) (Oral)   Resp 16   Ht 5' 10 (1.778 m)   Wt 160 lb (72.6 kg)   SpO2 98%   BMI 22.96 kg/m   Visual Acuity Right Eye Distance:   Left Eye Distance:   Bilateral Distance:    Right Eye Near:   Left Eye Near:    Bilateral Near:     Physical Exam Vitals reviewed.  Constitutional:      General:  He is awake.     Appearance: Normal appearance. He is well-developed and well-groomed.  HENT:     Head: Normocephalic and atraumatic.  Eyes:     Extraocular Movements: Extraocular movements intact.     Conjunctiva/sclera: Conjunctivae normal.  Pulmonary:     Effort: Pulmonary effort is normal.  Musculoskeletal:     Cervical back: Normal range of motion.  Neurological:     Mental Status: He is alert and oriented to person, place, and time.  Psychiatric:  Attention and Perception: Attention normal.        Mood and Affect: Mood normal.        Speech: Speech normal.        Behavior: Behavior normal. Behavior is cooperative.      UC Treatments / Results  Labs (all labs ordered are listed, but only abnormal results are displayed) Labs Reviewed  URINE CULTURE - Abnormal; Notable for the following components:      Result Value   Culture 40,000 COLONIES/mL ESCHERICHIA COLI (*)    Organism ID, Bacteria ESCHERICHIA COLI (*)    All other components within normal limits  POCT URINE DIPSTICK - Abnormal; Notable for the following components:   Color, UA straw (*)    Clarity, UA turbid (*)    Bilirubin, UA small (*)    Ketones, POC UA trace (5) (*)    Spec Grav, UA >=1.030 (*)    Blood, UA large (*)    POC PROTEIN,UA =30 (*)    Nitrite, UA Positive (*)    Leukocytes, UA Small (1+) (*)    All other components within normal limits  CYTOLOGY, (ORAL, ANAL, URETHRAL) ANCILLARY ONLY    EKG   Radiology No results found.  Procedures Procedures (including critical care time)  Medications Ordered in UC Medications - No data to display  Initial Impression / Assessment and Plan / UC Course  I have reviewed the triage vital signs and the nursing notes.  Pertinent labs & imaging results that were available during my care of the patient were reviewed by me and considered in my medical decision making (see chart for details).      Final Clinical Impressions(s) / UC Diagnoses    Final diagnoses:  Dysuria  Screening examination for STD (sexually transmitted disease)   Urinary tract infection Symptoms consistent with a urinary tract infection (UTI) for 2-3 days, including dysuria, frequency, and cloudy urine. Urinalysis shows hematuria, nitrites, and leukocytes, supporting UTI diagnosis. Differential includes sexually transmitted infections (STIs) such as gonorrhea, chlamydia, and trichomonas, for which swabs have been sent. UTIs are not contagious and do not require sexual activity to occur. - Prescribed Bactrim  twice daily for 7 days. - Sent urine culture to identify bacteria and appropriate antibiotics. - Sent swabs for gonorrhea, chlamydia, and trichomonas testing. - Advised to refrain from sexual activity until test results are negative or treatment is completed. - Instructed to follow up if a shot is needed. - Recommended consultation with primary care and urology if recurrent UTIs occur.     Discharge Instructions      VISIT SUMMARY:  You came in today with symptoms of a urinary tract infection (UTI), including frequent urination, mild discomfort, and cloudy urine. You have a history of UTIs and sexually transmitted infections (STIs), but your current symptoms are different from those of STIs. You do not have any fever, chills, or other concerning symptoms.  YOUR PLAN:  -URINARY TRACT INFECTION: A urinary tract infection (UTI) is an infection in any part of your urinary system. Your symptoms and urinalysis results support this diagnosis. You have been prescribed Bactrim  to take twice daily for 7 days. A urine culture has been sent to identify the bacteria causing the infection and to ensure the correct antibiotics are used. Swabs have also been sent to test for STIs like gonorrhea, chlamydia, and trichomonas. Please refrain from sexual activity until your test results are negative or your treatment is completed. Follow up if you need an additional shot. If  you experience recurrent UTIs, a consultation with your primary care doctor and a urologist is recommended.  INSTRUCTIONS:  Please take Bactrim  as prescribed, twice daily for 7 days. Refrain from sexual activity until your test results are negative or your treatment is completed. Follow up if you need an additional shot. If you experience recurrent UTIs, consult with your primary care doctor and a urologist.     ED Prescriptions     Medication Sig Dispense Auth. Provider   sulfamethoxazole -trimethoprim  (BACTRIM  DS) 800-160 MG tablet Take 1 tablet by mouth 2 (two) times daily for 7 days. 14 tablet Obed Samek E, PA-C      PDMP not reviewed this encounter.   Marylene Rocky BRAVO, PA-C 03/17/24 [id number]

## 2024-03-14 LAB — CYTOLOGY, (ORAL, ANAL, URETHRAL) ANCILLARY ONLY
Chlamydia: NEGATIVE
Comment: NEGATIVE
Comment: NEGATIVE
Comment: NORMAL
Neisseria Gonorrhea: NEGATIVE
Trichomonas: NEGATIVE

## 2024-03-15 ENCOUNTER — Ambulatory Visit: Payer: Self-pay

## 2024-03-15 LAB — URINE CULTURE: Culture: 40000 — AB

## 2024-04-10 ENCOUNTER — Ambulatory Visit: Payer: MEDICAID | Admitting: Physician Assistant

## 2024-04-10 ENCOUNTER — Encounter: Payer: Self-pay | Admitting: Physician Assistant

## 2024-04-10 VITALS — BP 134/92

## 2024-04-10 DIAGNOSIS — L720 Epidermal cyst: Secondary | ICD-10-CM | POA: Diagnosis not present

## 2024-04-10 NOTE — Progress Notes (Signed)
" ° °  New Patient Visit   Subjective  Billy Mann is a 43 y.o. male NEW PATIENT who presents for the following: Knot on his right neck x ~15 years and it getting bigger. It has drained a couple of times and it just comes right back.  He has a similar one on his left sideburn that is not as big. He was referred for a lesion of his lower lip that looked a wart but it is gone.  Accompanied by a friend today.  The following portions of the chart were reviewed this encounter and updated as appropriate: medications, allergies, medical history  Review of Systems:  No other skin or systemic complaints except as noted in HPI or Assessment and Plan.  Objective  Well appearing patient in no apparent distress; mood and affect are within normal limits.   A focused examination was performed of the following areas: Face, neck   Relevant exam findings are noted in the Assessment and Plan.     Assessment & Plan   EPIDERMAL INCLUSION CYST Exam: Subcutaneous nodule at right neck  Benign-appearing. Exam most consistent with an epidermal inclusion cyst. Discussed that a cyst is a benign growth that can grow over time and sometimes get irritated or inflamed. Recommend observation if it is not bothersome. Discussed option of surgical excision to remove it if it is growing, symptomatic, or other changes noted.   Plan referral to [facility] as patient requests excision.      EPIDERMAL CYST   This Visit - Ambulatory referral to Plastic Surgery  Return if symptoms worsen or fail to improve.  I, Roseline Hutchinson, CMA, am acting as scribe for Glorimar Stroope K, PA-C .   Documentation: I have reviewed the above documentation for accuracy and completeness, and I agree with the above.  Lyrique Hakim K, PA-C    "

## 2024-04-16 ENCOUNTER — Telehealth: Payer: MEDICAID | Admitting: Family

## 2024-04-16 DIAGNOSIS — R051 Acute cough: Secondary | ICD-10-CM

## 2024-04-16 DIAGNOSIS — R6889 Other general symptoms and signs: Secondary | ICD-10-CM

## 2024-04-16 DIAGNOSIS — M791 Myalgia, unspecified site: Secondary | ICD-10-CM | POA: Diagnosis not present

## 2024-04-16 DIAGNOSIS — J029 Acute pharyngitis, unspecified: Secondary | ICD-10-CM

## 2024-04-16 MED ORDER — OSELTAMIVIR PHOSPHATE 75 MG PO CAPS: 75.0000 mg | ORAL_CAPSULE | Freq: Two times a day (BID) | ORAL | 0 refills | Status: DC

## 2024-04-16 MED ORDER — BENZONATATE 100 MG PO CAPS: 100.0000 mg | ORAL_CAPSULE | Freq: Two times a day (BID) | ORAL | 0 refills | Status: DC | PRN

## 2024-04-16 NOTE — Progress Notes (Signed)
 E visit for Flu like symptoms   We are sorry that you are not feeling well.  Here is how we plan to help! Based on what you have shared with me it looks like you may have a respiratory virus that may be influenza.  Influenza or the flu is  an infection caused by a respiratory virus. The flu virus is highly contagious and persons who did not receive their yearly flu vaccination may catch the flu from close contact.  We have anti-viral medications to treat the viruses that cause this infection. They are not a cure and only shorten the course of the infection. These prescriptions are most effective when they are given within the first 2 days of flu symptoms. Antiviral medications are indicated if you have a high risk of complications from the flu. You should  also consider an antiviral medication if you are in close contact with someone who is at risk. These medications can help patients avoid complications from the flu but have side effects that you should know.   Possible side effects from Tamiflu  or oseltamivir  include nausea, vomiting, diarrhea, dizziness, headaches, eye redness, sleep problems or other respiratory symptoms. You should not take Tamiflu  if you have an allergy to oseltamivir  or any to the ingredients in Tamiflu .  Based upon your symptoms and potential risk factors I have prescribed Oseltamivir  (Tamiflu ).  It has been sent to your designated pharmacy.  You will take one 75 mg capsule orally twice a day for the next 5 days.   For nasal congestion, you may use an oral decongestant such as Mucinex  D or if you have glaucoma or high blood pressure use plain Mucinex .  Saline nasal spray or nasal drops can help and can safely be used as often as needed for congestion.  If you have a sore or scratchy throat, use a saltwater gargle-  to  teaspoon of salt dissolved in a 4-ounce to 8-ounce glass of warm water .  Gargle the solution for approximately 15-30 seconds and then spit.  It is  important not to swallow the solution.  You can also use throat lozenges/cough drops and Chloraseptic spray to help with throat pain or discomfort.  Warm or cold liquids can also be helpful in relieving throat pain.  For headache, pain or general discomfort, you can use Ibuprofen  or Tylenol  as directed.   Some authorities believe that zinc  sprays or the use of Echinacea may shorten the course of your symptoms.  I have prescribed the following medications to help lessen symptoms: I have prescribed Tessalon  Perles 100 mg. You may take 1-2 capsules every 8 hours as needed for cough  You are to isolate at home until you have been fever-free for at least 24 hours without a fever-reducing medication, and symptoms have been steadily improving for 24 hours.  If you must be around other household members who do not have symptoms, you need to make sure that both you and the family members are masking consistently with a high-quality mask.  If you note any worsening of symptoms despite treatment, please seek an in-person evaluation ASAP. If you note any significant shortness of breath or any chest pain, please seek ED evaluation. Please do not delay care!  ANYONE WHO HAS FLU SYMPTOMS SHOULD: Stay home. The flu is highly contagious and going out or to work exposes others! Be sure to drink plenty of fluids. Water  is fine as well as fruit juices, sodas and electrolyte beverages. You may want to stay  away from caffeine or alcohol. If you are nauseated, try taking small sips of liquids. How do you know if you are getting enough fluid? Your urine should be a pale yellow or almost colorless. Get rest. Taking a steamy shower or using a humidifier may help nasal congestion and ease sore throat pain. Using a saline nasal spray works much the same way. Cough drops, hard candies and sore throat lozenges may ease your cough. Line up a caregiver. Have someone check on you regularly.  GET HELP RIGHT AWAY IF: You cannot  keep down liquids or your medications. You become short of breath Your fell like you are going to pass out or loose consciousness. Your symptoms persist after you have completed your treatment plan  MAKE SURE YOU  Understand these instructions. Will watch your condition. Will get help right away if you are not doing well or get worse.  Your e-visit answers were reviewed by a board certified advanced clinical practitioner to complete your personal care plan.  Depending on the condition, your plan could have included both over the counter or prescription medications.  If there is a problem please reply  once you have received a response from your provider.  Your safety is important to us .  If you have drug allergies check your prescription carefully.    You can use MyChart to ask questions about todays visit, request a non-urgent call back, or ask for a work or school excuse for 24 hours related to this e-Visit. If it has been greater than 24 hours you will need to follow up with your provider, or enter a new e-Visit to address those concerns.  You will get an e-mail in the next two days asking about your experience.  I hope that your e-visit has been valuable and will speed your recovery. Thank you for using e-visits.   I have spent 5 minutes in review of e-visit questionnaire, review and updating patient chart, medical decision making and response to patient.   Bari Learn, FNP

## 2024-04-23 ENCOUNTER — Encounter (HOSPITAL_COMMUNITY): Payer: Self-pay

## 2024-04-23 ENCOUNTER — Inpatient Hospital Stay (HOSPITAL_COMMUNITY)
Admission: EM | Admit: 2024-04-23 | Discharge: 2024-04-25 | DRG: 372 | Disposition: A | Discharge status: Home / Self Care | Payer: MEDICAID | Attending: Internal Medicine | Admitting: Internal Medicine

## 2024-04-23 ENCOUNTER — Other Ambulatory Visit: Payer: Self-pay

## 2024-04-23 ENCOUNTER — Emergency Department (HOSPITAL_COMMUNITY): Payer: MEDICAID

## 2024-04-23 DIAGNOSIS — N179 Acute kidney failure, unspecified: Secondary | ICD-10-CM | POA: Diagnosis present

## 2024-04-23 DIAGNOSIS — E876 Hypokalemia: Secondary | ICD-10-CM | POA: Diagnosis present

## 2024-04-23 DIAGNOSIS — R5381 Other malaise: Secondary | ICD-10-CM | POA: Diagnosis present

## 2024-04-23 DIAGNOSIS — D72829 Elevated white blood cell count, unspecified: Secondary | ICD-10-CM | POA: Diagnosis present

## 2024-04-23 DIAGNOSIS — E869 Volume depletion, unspecified: Secondary | ICD-10-CM | POA: Diagnosis present

## 2024-04-23 DIAGNOSIS — R000 Tachycardia, unspecified: Secondary | ICD-10-CM | POA: Diagnosis present

## 2024-04-23 DIAGNOSIS — Z79899 Other long term (current) drug therapy: Secondary | ICD-10-CM

## 2024-04-23 DIAGNOSIS — Z833 Family history of diabetes mellitus: Secondary | ICD-10-CM

## 2024-04-23 DIAGNOSIS — R197 Diarrhea, unspecified: Secondary | ICD-10-CM | POA: Diagnosis not present

## 2024-04-23 DIAGNOSIS — E782 Mixed hyperlipidemia: Secondary | ICD-10-CM | POA: Diagnosis present

## 2024-04-23 DIAGNOSIS — Z1152 Encounter for screening for COVID-19: Secondary | ICD-10-CM

## 2024-04-23 DIAGNOSIS — Z880 Allergy status to penicillin: Secondary | ICD-10-CM

## 2024-04-23 DIAGNOSIS — A039 Shigellosis, unspecified: Secondary | ICD-10-CM | POA: Diagnosis present

## 2024-04-23 DIAGNOSIS — A041 Enterotoxigenic Escherichia coli infection: Principal | ICD-10-CM | POA: Diagnosis present

## 2024-04-23 DIAGNOSIS — G4730 Sleep apnea, unspecified: Secondary | ICD-10-CM | POA: Diagnosis present

## 2024-04-23 DIAGNOSIS — R509 Fever, unspecified: Secondary | ICD-10-CM

## 2024-04-23 DIAGNOSIS — F319 Bipolar disorder, unspecified: Secondary | ICD-10-CM | POA: Diagnosis present

## 2024-04-23 DIAGNOSIS — Z87891 Personal history of nicotine dependence: Secondary | ICD-10-CM

## 2024-04-23 DIAGNOSIS — Z8249 Family history of ischemic heart disease and other diseases of the circulatory system: Secondary | ICD-10-CM

## 2024-04-23 DIAGNOSIS — I10 Essential (primary) hypertension: Secondary | ICD-10-CM | POA: Diagnosis present

## 2024-04-23 DIAGNOSIS — E871 Hypo-osmolality and hyponatremia: Secondary | ICD-10-CM | POA: Diagnosis present

## 2024-04-23 DIAGNOSIS — F411 Generalized anxiety disorder: Secondary | ICD-10-CM | POA: Diagnosis present

## 2024-04-23 DIAGNOSIS — K219 Gastro-esophageal reflux disease without esophagitis: Secondary | ICD-10-CM | POA: Diagnosis present

## 2024-04-23 LAB — RESP PANEL BY RT-PCR (RSV, FLU A&B, COVID)  RVPGX2
Influenza A by PCR: NEGATIVE
Influenza B by PCR: NEGATIVE
Resp Syncytial Virus by PCR: NEGATIVE
SARS Coronavirus 2 by RT PCR: NEGATIVE

## 2024-04-23 LAB — CBC WITH DIFFERENTIAL/PLATELET
Abs Immature Granulocytes: 0.3 K/uL — ABNORMAL HIGH (ref 0.00–0.07)
Basophils Absolute: 0 K/uL (ref 0.0–0.1)
Basophils Relative: 0 %
Blasts: 1 %
Eosinophils Absolute: 0 K/uL (ref 0.0–0.5)
Eosinophils Relative: 0 %
HCT: 41.7 % (ref 39.0–52.0)
Hemoglobin: 14.8 g/dL (ref 13.0–17.0)
Lymphocytes Relative: 12 %
Lymphs Abs: 1.8 K/uL (ref 0.7–4.0)
MCH: 30.9 pg (ref 26.0–34.0)
MCHC: 35.5 g/dL (ref 30.0–36.0)
MCV: 87.1 fL (ref 80.0–100.0)
Metamyelocytes Relative: 2 %
Monocytes Absolute: 1 K/uL (ref 0.1–1.0)
Monocytes Relative: 7 %
Neutro Abs: 11.4 K/uL — ABNORMAL HIGH (ref 1.7–7.7)
Neutrophils Relative %: 78 %
Platelets: 238 K/uL (ref 150–400)
RBC: 4.79 MIL/uL (ref 4.22–5.81)
RDW: 12.1 % (ref 11.5–15.5)
WBC: 14.6 K/uL — ABNORMAL HIGH (ref 4.0–10.5)
nRBC: 0 % (ref 0.0–0.2)

## 2024-04-23 LAB — COMPREHENSIVE METABOLIC PANEL WITH GFR
ALT: 42 U/L (ref 0–44)
AST: 27 U/L (ref 15–41)
Albumin: 3.6 g/dL (ref 3.5–5.0)
Alkaline Phosphatase: 82 U/L (ref 38–126)
Anion gap: 13 (ref 5–15)
BUN: 22 mg/dL — ABNORMAL HIGH (ref 6–20)
CO2: 20 mmol/L — ABNORMAL LOW (ref 22–32)
Calcium: 8.5 mg/dL — ABNORMAL LOW (ref 8.9–10.3)
Chloride: 100 mmol/L (ref 98–111)
Creatinine, Ser: 1.45 mg/dL — ABNORMAL HIGH (ref 0.61–1.24)
GFR, Estimated: >60 mL/min
Glucose, Bld: 118 mg/dL — ABNORMAL HIGH (ref 70–99)
Potassium: 3.4 mmol/L — ABNORMAL LOW (ref 3.5–5.1)
Sodium: 132 mmol/L — ABNORMAL LOW (ref 135–145)
Total Bilirubin: 0.7 mg/dL (ref 0.0–1.2)
Total Protein: 6.3 g/dL — ABNORMAL LOW (ref 6.5–8.1)

## 2024-04-23 LAB — C DIFFICILE QUICK SCREEN W PCR REFLEX
C Diff antigen: NEGATIVE
C Diff interpretation: NOT DETECTED
C Diff toxin: NEGATIVE

## 2024-04-23 LAB — I-STAT CG4 LACTIC ACID, ED: Lactic Acid, Venous: 0.7 mmol/L (ref 0.5–1.9)

## 2024-04-23 LAB — LIPASE, BLOOD: Lipase: 12 U/L (ref 11–51)

## 2024-04-23 MED ORDER — LACTATED RINGERS IV BOLUS
1000.0000 mL | Freq: Once | INTRAVENOUS | Status: AC
  Administered 2024-04-23: 1000 mL via INTRAVENOUS

## 2024-04-23 MED ORDER — ONDANSETRON HCL 4 MG PO TABS: 4.0000 mg | ORAL_TABLET | Freq: Four times a day (QID) | ORAL | Status: DC | PRN

## 2024-04-23 MED ORDER — ACETAMINOPHEN 650 MG RE SUPP: 650.0000 mg | Freq: Four times a day (QID) | RECTAL | Status: DC | PRN

## 2024-04-23 MED ORDER — ATORVASTATIN CALCIUM 40 MG PO TABS
20.0000 mg | ORAL_TABLET | Freq: Every day | ORAL | Status: DC
  Administered 2024-04-24: 20 mg via ORAL
  Filled 2024-04-23: qty 2

## 2024-04-23 MED ORDER — ACETAMINOPHEN 325 MG PO TABS
650.0000 mg | ORAL_TABLET | Freq: Four times a day (QID) | ORAL | Status: DC | PRN
  Administered 2024-04-24: 650 mg via ORAL
  Filled 2024-04-23: qty 2

## 2024-04-23 MED ORDER — ENOXAPARIN SODIUM 40 MG/0.4ML IJ SOSY
40.0000 mg | PREFILLED_SYRINGE | INTRAMUSCULAR | Status: DC
  Administered 2024-04-24 – 2024-04-25 (×2): 40 mg via SUBCUTANEOUS
  Filled 2024-04-23 (×2): qty 0.4

## 2024-04-23 MED ORDER — IBUPROFEN 800 MG PO TABS
800.0000 mg | ORAL_TABLET | Freq: Once | ORAL | Status: AC
  Administered 2024-04-23: 800 mg via ORAL
  Filled 2024-04-23: qty 1

## 2024-04-23 MED ORDER — POTASSIUM CHLORIDE 20 MEQ PO PACK
40.0000 meq | PACK | Freq: Once | ORAL | Status: DC
  Filled 2024-04-23: qty 2

## 2024-04-23 MED ORDER — SODIUM CHLORIDE 0.9 % IV SOLN: INTRAVENOUS | Status: AC

## 2024-04-23 MED ORDER — ONDANSETRON HCL 4 MG/2ML IJ SOLN
4.0000 mg | Freq: Four times a day (QID) | INTRAMUSCULAR | Status: DC | PRN
  Administered 2024-04-24: 4 mg via INTRAVENOUS
  Filled 2024-04-23: qty 2

## 2024-04-23 MED ORDER — MORPHINE SULFATE (PF) 4 MG/ML IV SOLN
4.0000 mg | Freq: Once | INTRAVENOUS | Status: AC
  Administered 2024-04-23: 4 mg via INTRAVENOUS
  Filled 2024-04-23: qty 1

## 2024-04-23 NOTE — ED Notes (Signed)
 Pt cleaned by this NT and Topher, RN. Callbell in reach.

## 2024-04-23 NOTE — ED Provider Notes (Signed)
 " [place] [facility] Provider Note   CSN: [id number] Arrival date & time: 04/23/24  8491     Patient presents with: Influenza   Billy Mann is a 44 y.o. male.   Pt complains of fever, body aches and diarrhea.  Pt reports fever, body aches, nausea vomiting and diarrhea. Pt reports multi episodes of diarrhea today.  Pt not able to eat or drink in 4 days.  Pt feels like he has the flu.  Pt complains of leg cramping.  Patient has a past medical history of diabetes depression hyperlipidemia hypertension and bipolar disease.  Patient has had a C. difficile colitis in the past.  Patient reports he had a fever at home earlier today.  Patient was given Tylenol  by EMS.  The history is provided by the patient. No language interpreter was used.  Influenza      Prior to Admission medications  Medication Sig Start Date End Date Taking? Authorizing Provider  albuterol  (VENTOLIN  HFA) 108 (90 Base) MCG/ACT inhaler Inhale 2 puffs into the lungs every 6 (six) hours as needed for wheezing or shortness of breath. 09/24/23   Blair, Diane W, FNP  atorvastatin  (LIPITOR) 20 MG tablet Take 1 tablet (20 mg total) by mouth daily. 08/14/21   Tanda Bleacher, MD  benzonatate  (TESSALON ) 100 MG capsule Take 1 capsule (100 mg total) by mouth 2 (two) times daily as needed for cough. 04/16/24   Lavell Bari LABOR, FNP  DESCOVY 200-25 MG tablet SMARTSIG:1 Tablet(s) By Mouth 09/15/23   [provider]  famotidine  (PEPCID ) 40 MG tablet Take 1 tablet by mouth once daily 12/02/21   Kennedy-Smith, Colleen M, NP  hydrochlorothiazide  (HYDRODIURIL ) 25 MG tablet Take 1 tablet (25 mg total) by mouth daily. 08/14/21   Tanda Bleacher, MD  oseltamivir  (TAMIFLU ) 75 MG capsule Take 1 capsule (75 mg total) by mouth 2 (two) times daily. 04/16/24   Lavell Bari A, FNP  pantoprazole  (PROTONIX ) 20 MG tablet Take 1 tablet (20 mg total) by mouth daily. 01/03/23   Fleming, Zelda W, NP  sildenafil  (VIAGRA )  100 MG tablet TAKE 1/2 TO 1 (ONE-HALF TO ONE) TABLET BY MOUTH ONCE DAILY AS NEEDED FOR ERECTILE DYSFUNCTION Patient not taking: Reported on 04/10/2024 06/10/22   Tanda Bleacher, MD    Allergies: Penicillins    Review of Systems  All other systems reviewed and are negative.   Updated Vital Signs BP 122/82   Pulse (!) 120   Temp (!) 102 F (38.9 C) (Rectal)   Resp 17   Ht 5' 10 (1.778 m)   Wt 72.6 kg   SpO2 100%   BMI 22.96 kg/m   Physical Exam Vitals and nursing note reviewed.  Constitutional:      Appearance: He is well-developed.  HENT:     Head: Normocephalic.     Nose: Nose normal.     Mouth/Throat:     Mouth: Mucous membranes are moist.  Cardiovascular:     Rate and Rhythm: Tachycardia present.  Pulmonary:     Effort: Pulmonary effort is normal.  Abdominal:     General: There is no distension.     Palpations: Abdomen is soft.  Musculoskeletal:        General: Normal range of motion.  Skin:    General: Skin is warm.  Neurological:     General: No focal deficit present.     Mental Status: He is alert and oriented to person, place, and time.  Psychiatric:  Mood and Affect: Mood normal.     (all labs ordered are listed, but only abnormal results are displayed) Labs Reviewed  CBC WITH DIFFERENTIAL/PLATELET - Abnormal; Notable for the following components:      Result Value   WBC 14.6 (*)    Neutro Abs 11.4 (*)    Abs Immature Granulocytes 0.30 (*)    All other components within normal limits  COMPREHENSIVE METABOLIC PANEL WITH GFR - Abnormal; Notable for the following components:   Sodium 132 (*)    Potassium 3.4 (*)    CO2 20 (*)    Glucose, Bld 118 (*)    BUN 22 (*)    Creatinine, Ser 1.45 (*)    Calcium  8.5 (*)    Total Protein 6.3 (*)    All other components within normal limits  RESP PANEL BY RT-PCR (RSV, FLU A&B, COVID)  RVPGX2  LIPASE, BLOOD  URINALYSIS, ROUTINE W REFLEX MICROSCOPIC  I-STAT CG4 LACTIC ACID, ED     EKG: None  Radiology: DG Chest Port 1 View Result Date: 04/23/2024 CLINICAL DATA:  Fever EXAM: PORTABLE CHEST 1 VIEW COMPARISON:  None Available. FINDINGS: Normal lung volumes. No focal consolidations. No pleural effusion or pneumothorax. The heart size and mediastinal contours are within normal limits. No acute osseous abnormality. IMPRESSION: No active disease. Electronically Signed   By: Limin  Xu M.D.   On: 04/23/2024 16:18     Procedures   Medications Ordered in the ED  ibuprofen  (ADVIL ) tablet 800 mg (has no administration in time range)  lactated ringers  bolus 1,000 mL (0 mLs Intravenous Stopped 04/23/24 1841)  morphine  (PF) 4 MG/ML injection 4 mg (4 mg Intravenous Given 04/23/24 1631)                                    Medical Decision Making Patient complains of fever chills and bodyaches.  Patient reports he has had multiple episodes of diarrhea.  Patient reports no food intake in 4 days, limited fluid intake.  Amount and/or Complexity of Data Reviewed Independent Historian: EMS    Details: Patient is brought in by EMS who provides history, patient also has a neighbor who presented and reports patient has been sick with a large amount of diarrhea. External Data Reviewed: notes.    Details: Urgent care and ED visit notes reviewed Labs: ordered. Decision-making details documented in ED Course.    Details: Labs ordered reviewed and interpreted patient has elevated white blood cell count of 14.6.  BUN and creatinine are elevated at 22 and 1.45.  Patient's potassium is 3.4 sodium is 132.  Influenza COVID and RSV are negative Radiology: ordered and independent interpretation performed. Decision-making details documented in ED Course.    Details: Chest x-ray ordered reviewed and interpreted shows no acute findings Discussion of management or test interpretation with external provider(s): Hospitalist consulted for admission   Risk Prescription drug management. Decision  regarding hospitalization. Risk Details: Patient given IV fluids x 1 L.  Patient has had multiple episodes of a small amount of diarrhea.  Patient's vital signs rechecked.  Patient's temperature has increased to 102.  Patient has had increasing tachycardia.  Patient is given additional fluid bolus.  Lactic acid ordered.  Patient now having a large amount of diarrhea.  Stool pathogen testing ordered. C diff is negative.  Gi panel pending        Final diagnoses:  Diarrhea, unspecified type  Fever, unspecified fever cause    ED Discharge Orders     None          Mery Guadalupe K, PA-C 04/23/24 2235    Charlyn Sora, MD 04/27/24 1411  "

## 2024-04-23 NOTE — ED Notes (Signed)
 Pt incontinent of stool and cleaned and repositioned in bed.

## 2024-04-23 NOTE — H&P (Signed)
 " History and Physical    Billy Mann FMW:[id number] DOB: Jul 29, 1980 DOA: 04/23/2024  PCP: Jori Small, FNP   Chief Complaint: ***  HPI: Billy Mann is a 44 y.o. male with medical history significant of hypertension, hyperlipidemia who presented to emergency department due to diarrhea.  Patient had profuse episodes of diarrhea unable to keep p.o. intake down.  He presented to the ER for further assessment.  On arrival he was afebrile and hemodynamically stable.  He subsidy developed fever of 102.  He has a history of C. difficile in the past.  Labs were obtained which showed WBC 14.6, hemoglobin 14.8, potassium 3.4, creatinine 1.4, lipase within normal limits, respiratory viral panel was negative.  Lactic acid 0.7.  Patient underwent chest x-ray which showed no acute findings.  Patient was given IV fluids and admitted for further workup.   Review of Systems: Review of Systems  All other systems reviewed and are negative.    As per HPI otherwise 10 point review of systems negative.   Allergies[1]  Past Medical History:  Diagnosis Date   Anxiety    Phreesia 03/30/2020   Anxiety    Depression    [employer]reesia 03/30/2020   Depression    GERD (gastroesophageal reflux disease)    Phreesia 03/30/2020   High cholesterol    Hyperlipidemia    Phreesia 03/30/2020   Hypertension    Phreesia 03/30/2020   Sleep apnea    Phreesia 03/30/2020    Past Surgical History:  Procedure Laterality Date   FOOT SURGERY     bilateral hammer toe   TONSILLECTOMY       reports that he has been smoking cigarettes. He has never used smokeless tobacco. 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 He reports that he does not drink alcohol.  Family History  Problem Relation Age of Onset   Heart disease Mother    Heart disease Father    Diabetes Paternal Grandmother     Prior to Admission medications  Medication Sig Start Date End Date Taking? Authorizing Provider  albuterol   (VENTOLIN  HFA) 108 (90 Base) MCG/ACT inhaler Inhale 2 puffs into the lungs every 6 (six) hours as needed for wheezing or shortness of breath. 09/24/23   Blair, Diane W, FNP  atorvastatin  (LIPITOR) 20 MG tablet Take 1 tablet (20 mg total) by mouth daily. 08/14/21   Tanda Bleacher, MD  benzonatate  (TESSALON ) 100 MG capsule Take 1 capsule (100 mg total) by mouth 2 (two) times daily as needed for cough. 04/16/24   Lavell Bari LABOR, FNP  DESCOVY 200-25 MG tablet SMARTSIG:1 Tablet(s) By Mouth 09/15/23   [provider]  famotidine  (PEPCID ) 40 MG tablet Take 1 tablet by mouth once daily 12/02/21   Kennedy-Smith, Colleen M, NP  hydrochlorothiazide  (HYDRODIURIL ) 25 MG tablet Take 1 tablet (25 mg total) by mouth daily. 08/14/21   Tanda Bleacher, MD  oseltamivir  (TAMIFLU ) 75 MG capsule Take 1 capsule (75 mg total) by mouth 2 (two) times daily. 04/16/24   Lavell Bari LABOR, FNP  pantoprazole  (PROTONIX ) 20 MG tablet Take 1 tablet (20 mg total) by mouth daily. 01/03/23   Fleming, Zelda W, NP  sildenafil  (VIAGRA ) 100 MG tablet TAKE 1/2 TO 1 (ONE-HALF TO ONE) TABLET BY MOUTH ONCE DAILY AS NEEDED FOR ERECTILE DYSFUNCTION Patient not taking: Reported on 04/10/2024 06/10/22   Tanda Bleacher, MD    Physical Exam: Vitals:   04/23/24 2100 04/23/24 2115 04/23/24 2130 04/23/24 2144  BP: 101/73 91/66 107/76   Pulse: (!) 125 ROLLEN)  127 (!) 109   Resp: 13 19 16    Temp:    99.1 F (37.3 C)  TempSrc:    Oral  SpO2: 98% 99% 96%   Weight:      Height:       Physical Exam Vitals reviewed.  Constitutional:      Appearance: He is normal weight.  HENT:     Head: Normocephalic.     Nose: Nose normal.     Mouth/Throat:     Mouth: Mucous membranes are moist.     Pharynx: Oropharynx is clear.  Cardiovascular:     Rate and Rhythm: Normal rate and regular rhythm.     Pulses: Normal pulses.  Pulmonary:     Effort: Pulmonary effort is normal.     Breath sounds: Normal breath sounds.  Musculoskeletal:        General:  Normal range of motion.     Cervical back: Normal range of motion.  Skin:    General: Skin is warm.     Capillary Refill: Capillary refill takes less than 2 seconds.  Neurological:     General: No focal deficit present.     Mental Status: He is alert. Mental status is at baseline.  Psychiatric:        Mood and Affect: Mood normal.        Labs on Admission: I have personally reviewed the patients's labs and imaging studies.  Assessment/Plan Principal Problem:   Diarrhea   # Acute diarrhea - Patient had greater than 10 bowel movements - Evidence of acute kidney injury - Hypokalemia - C. difficile negative  Plan: Continue IV hydration Follow-up GI pathogen panel  # Hyperlipidemia-continue atorvastatin  ***   Admission status: Observation Med-Surg  Certification: The appropriate patient status for this patient is OBSERVATION. Observation status is judged to be reasonable and necessary in order to provide the required intensity of service to ensure the patient's safety. The patient's presenting symptoms, physical exam findings, and initial radiographic and laboratory data in the context of their medical condition is felt to place them at decreased risk for further clinical deterioration. Furthermore, it is anticipated that the patient will be medically stable for discharge from the hospital within 2 midnights of admission.     Lamar Dess MD [facility] If 7PM-7AM, please contact night-coverage [email or web address]  04/23/2024, 10:32 PM        [1]  Allergies Allergen Reactions   Penicillins Anaphylaxis   "

## 2024-04-23 NOTE — ED Triage Notes (Signed)
 PT BIB [facility] from home for CC N/V/D x4 days, complaining of pain in legs and abdomen. PT states unable to eat x4 days. EMS state temp was 107 temporal, gave 650mg  tylenol  en route oral. Aox4.   EMS VS: BP 122/83, HR 113, CBG 134

## 2024-04-23 NOTE — ED Notes (Signed)
 Pt had another occurrence of diarrhea, pt changed and repositioned. Pt still has no urine output at this time.

## 2024-04-23 NOTE — ED Notes (Signed)
 Pt cleaned by this NT and Topher, RN.

## 2024-04-24 DIAGNOSIS — R5381 Other malaise: Secondary | ICD-10-CM | POA: Diagnosis present

## 2024-04-24 DIAGNOSIS — E869 Volume depletion, unspecified: Secondary | ICD-10-CM | POA: Diagnosis present

## 2024-04-24 DIAGNOSIS — R509 Fever, unspecified: Secondary | ICD-10-CM | POA: Diagnosis present

## 2024-04-24 DIAGNOSIS — Z743 Need for continuous supervision: Secondary | ICD-10-CM | POA: Diagnosis not present

## 2024-04-24 DIAGNOSIS — Z8249 Family history of ischemic heart disease and other diseases of the circulatory system: Secondary | ICD-10-CM | POA: Diagnosis not present

## 2024-04-24 DIAGNOSIS — F319 Bipolar disorder, unspecified: Secondary | ICD-10-CM | POA: Diagnosis present

## 2024-04-24 DIAGNOSIS — I10 Essential (primary) hypertension: Secondary | ICD-10-CM | POA: Diagnosis present

## 2024-04-24 DIAGNOSIS — Z833 Family history of diabetes mellitus: Secondary | ICD-10-CM | POA: Diagnosis not present

## 2024-04-24 DIAGNOSIS — A039 Shigellosis, unspecified: Secondary | ICD-10-CM | POA: Diagnosis present

## 2024-04-24 DIAGNOSIS — E782 Mixed hyperlipidemia: Secondary | ICD-10-CM | POA: Diagnosis present

## 2024-04-24 DIAGNOSIS — A041 Enterotoxigenic Escherichia coli infection: Secondary | ICD-10-CM | POA: Diagnosis present

## 2024-04-24 DIAGNOSIS — Z1152 Encounter for screening for COVID-19: Secondary | ICD-10-CM | POA: Diagnosis not present

## 2024-04-24 DIAGNOSIS — Z880 Allergy status to penicillin: Secondary | ICD-10-CM | POA: Diagnosis not present

## 2024-04-24 DIAGNOSIS — J101 Influenza due to other identified influenza virus with other respiratory manifestations: Secondary | ICD-10-CM | POA: Diagnosis not present

## 2024-04-24 DIAGNOSIS — N179 Acute kidney failure, unspecified: Secondary | ICD-10-CM | POA: Diagnosis present

## 2024-04-24 DIAGNOSIS — R197 Diarrhea, unspecified: Secondary | ICD-10-CM | POA: Diagnosis present

## 2024-04-24 DIAGNOSIS — K219 Gastro-esophageal reflux disease without esophagitis: Secondary | ICD-10-CM | POA: Diagnosis present

## 2024-04-24 DIAGNOSIS — R112 Nausea with vomiting, unspecified: Secondary | ICD-10-CM | POA: Diagnosis not present

## 2024-04-24 DIAGNOSIS — E871 Hypo-osmolality and hyponatremia: Secondary | ICD-10-CM | POA: Diagnosis present

## 2024-04-24 DIAGNOSIS — R000 Tachycardia, unspecified: Secondary | ICD-10-CM | POA: Diagnosis present

## 2024-04-24 DIAGNOSIS — E876 Hypokalemia: Secondary | ICD-10-CM | POA: Diagnosis present

## 2024-04-24 DIAGNOSIS — Z87891 Personal history of nicotine dependence: Secondary | ICD-10-CM | POA: Diagnosis not present

## 2024-04-24 DIAGNOSIS — G4730 Sleep apnea, unspecified: Secondary | ICD-10-CM | POA: Diagnosis present

## 2024-04-24 DIAGNOSIS — D72829 Elevated white blood cell count, unspecified: Secondary | ICD-10-CM | POA: Diagnosis present

## 2024-04-24 DIAGNOSIS — F411 Generalized anxiety disorder: Secondary | ICD-10-CM | POA: Diagnosis present

## 2024-04-24 DIAGNOSIS — A09 Infectious gastroenteritis and colitis, unspecified: Secondary | ICD-10-CM | POA: Diagnosis not present

## 2024-04-24 DIAGNOSIS — Z79899 Other long term (current) drug therapy: Secondary | ICD-10-CM | POA: Diagnosis not present

## 2024-04-24 LAB — GASTROINTESTINAL PANEL BY PCR, STOOL (REPLACES STOOL CULTURE)
Adenovirus F40/41: NOT DETECTED
Astrovirus: NOT DETECTED
Campylobacter species: NOT DETECTED
Cryptosporidium: NOT DETECTED
Cyclospora cayetanensis: NOT DETECTED
Entamoeba histolytica: NOT DETECTED
Enteroaggregative E coli (EAEC): DETECTED — AB
Enteropathogenic E coli (EPEC): NOT DETECTED
Enterotoxigenic E coli (ETEC): NOT DETECTED
Giardia lamblia: NOT DETECTED
Norovirus GI/GII: NOT DETECTED
Plesimonas shigelloides: NOT DETECTED
Rotavirus A: NOT DETECTED
Salmonella species: NOT DETECTED
Sapovirus (I, II, IV, and V): NOT DETECTED
Shiga like toxin producing E coli (STEC): NOT DETECTED
Shigella/Enteroinvasive E coli (EIEC): DETECTED — AB
Vibrio cholerae: NOT DETECTED
Vibrio species: NOT DETECTED
Yersinia enterocolitica: NOT DETECTED

## 2024-04-24 LAB — BASIC METABOLIC PANEL WITH GFR
Anion gap: 14 (ref 5–15)
BUN: 29 mg/dL — ABNORMAL HIGH (ref 6–20)
CO2: 18 mmol/L — ABNORMAL LOW (ref 22–32)
Calcium: 6.9 mg/dL — ABNORMAL LOW (ref 8.9–10.3)
Chloride: 106 mmol/L (ref 98–111)
Creatinine, Ser: 2.16 mg/dL — ABNORMAL HIGH (ref 0.61–1.24)
GFR, Estimated: 38 mL/min — ABNORMAL LOW
Glucose, Bld: 108 mg/dL — ABNORMAL HIGH (ref 70–99)
Potassium: 3.2 mmol/L — ABNORMAL LOW (ref 3.5–5.1)
Sodium: 137 mmol/L (ref 135–145)

## 2024-04-24 LAB — CBC
HCT: 39 % (ref 39.0–52.0)
Hemoglobin: 13.5 g/dL (ref 13.0–17.0)
MCH: 30.9 pg (ref 26.0–34.0)
MCHC: 34.6 g/dL (ref 30.0–36.0)
MCV: 89.2 fL (ref 80.0–100.0)
Platelets: 199 K/uL (ref 150–400)
RBC: 4.37 MIL/uL (ref 4.22–5.81)
RDW: 12.4 % (ref 11.5–15.5)
WBC: 11.1 K/uL — ABNORMAL HIGH (ref 4.0–10.5)
nRBC: 0 % (ref 0.0–0.2)

## 2024-04-24 LAB — MAGNESIUM: Magnesium: 1.5 mg/dL — ABNORMAL LOW (ref 1.7–2.4)

## 2024-04-24 LAB — HIV ANTIBODY (ROUTINE TESTING W REFLEX): HIV Screen 4th Generation wRfx: NONREACTIVE

## 2024-04-24 MED ORDER — CIPROFLOXACIN IN D5W 400 MG/200ML IV SOLN
400.0000 mg | Freq: Two times a day (BID) | INTRAVENOUS | Status: DC
  Administered 2024-04-24 – 2024-04-25 (×3): 400 mg via INTRAVENOUS
  Filled 2024-04-24 (×3): qty 200

## 2024-04-24 MED ORDER — POTASSIUM CHLORIDE CRYS ER 20 MEQ PO TBCR
40.0000 meq | EXTENDED_RELEASE_TABLET | Freq: Once | ORAL | Status: AC
  Administered 2024-04-24: 40 meq via ORAL
  Filled 2024-04-24: qty 2

## 2024-04-24 NOTE — ED Notes (Signed)
 [facility] picking up pt for transport to [facility]

## 2024-04-24 NOTE — ED Notes (Signed)
 Patient did not want to attempt oral medications at this time

## 2024-04-24 NOTE — Hospital Course (Signed)
 Billy Mann is a 44 y.o. male with past medical history significant of hypertension, hyperlipidemia presented to emergency department due to multiple episodes of diarrhea and difficulty keeping food down.  In the ED, patient developed fever of 102 F.  History of C. difficile in the past.  Initial labs were notable for WBC at 14.6 with creatinine of 1.4.  Respiratory viral panel was negative.  Lactic acid was 0.7.  Chest x-ray showed no infiltrate.  Patient was given IV fluids and was admitted hospital for further evaluation and treatment.    Acute diarrhea with enteroinvasive E. coli and Shigella Patient had mild AKI and hypokalemia but C. difficile is negative.  Will continue supportive care.  Patient had severe symptoms with fever.  Might benefit from antibiotic.  Will check with ID.  Hypokalemia.  Mild.  Replenished.  Check BMP.  Mild hyponatremia.  Received IV fluids.  Check BMP.   Hyperlipidemia-continue atorvastatin 

## 2024-04-24 NOTE — Progress Notes (Signed)
 " PROGRESS NOTE  Billy Mann FMW:[id number] DOB: March 20, 1981 DOA: 04/23/2024 PCP: Jori Small, FNP   LOS: 0 days   Brief narrative:  Billy Mann is a 44 y.o. male with past medical history significant of hypertension, hyperlipidemia presented to emergency department due to multiple episodes of diarrhea and difficulty keeping food down.  In the ED, patient developed fever of 102 F.  History of C. difficile in the past.  Initial labs were notable for WBC at 14.6 with creatinine of 1.4.  Respiratory viral panel was negative.  Lactic acid was 0.7.  Chest x-ray showed no infiltrate.  Patient was given IV fluids and was admitted hospital for further evaluation and treatment.    Assessment/Plan: Principal Problem:   Diarrhea  Acute diarrhea with enteroinvasive E. coli and Shigella Patient had mild AKI and hypokalemia but C. difficile is negative.  Will continue supportive care.  Patient had severe symptoms with fever.  Patient has penicillin allergy with anaphylaxis.  Will continue with ciprofloxacin  since patient had abdominal cramps with fever with significant volume depletion and decreased appetite.  Hypokalemia.  Mild.  Replenished.  Check BMP.  Mild hyponatremia.  Received IV fluids.  Check BMP.  Will continue with IV fluids.   Hyperlipidemia-hold Lipitor for now.  Debility weakness.  Likely secondary to diarrhea.  Will ambulate the patient as able.  DVT prophylaxis: enoxaparin  (LOVENOX ) injection 40 mg Start: 04/24/24 1000 SCDs Start: 04/23/24 2232   Disposition: Home likely in 1 to 2 days  Status is: Observation The patient will require care spanning > 2 midnights and should be moved to inpatient because: Pending clinical improvement, IV antibiotic, need for hydration    Code Status:     Code Status: Full Code  Family Communication: None at bedside  Consultants: None  Procedures: None  Anti-infectives:  Ciprofloxacin   Anti-infectives (From admission,  onward)    Start     Dose/Rate Route Frequency Ordered Stop   04/24/24 1115  ciprofloxacin  (CIPRO ) IVPB 400 mg        400 mg 200 mL/hr over 60 Minutes Intravenous Every 12 hours 04/24/24 1108          Subjective: Today, patient was seen and examined at bedside.  Patient still complains of weakness fatigue poor oral intake and has been having multiple episodes of bloody diarrhea with abdominal cramps and pain.  Has had fever.  Objective: Vitals:   04/24/24 0802 04/24/24 0815  BP:  94/64  Pulse:    Resp:  18  Temp:    SpO2: 94%     Intake/Output Summary (Last 24 hours) at 04/24/2024 1112 Last data filed at 04/24/2024 0140 Gross per 24 hour  Intake 1000 ml  Output --  Net 1000 ml   Filed Weights   04/23/24 1521  Weight: 72.6 kg   Body mass index is 22.96 kg/m.   Physical Exam: GENERAL: Patient is alert awake and oriented. Not in obvious distress.  Thinly built, HENT: No scleral pallor or icterus. Pupils equally reactive to light. Oral mucosa is slightly dry NECK: is supple, no gross swelling noted. CHEST: Clear to auscultation. No crackles or wheezes.  Diminished breath sounds bilaterally. CVS: S1 and S2 heard, no murmur. Regular rate and rhythm.  ABDOMEN: Soft, diffuse tenderness noted on palpation, guarding noted bowel sounds are present. EXTREMITIES: No edema. CNS: Cranial nerves are intact. No focal motor deficits. SKIN: warm and dry without rashes.  Data Review: I have personally reviewed the following laboratory data and studies,  CBC: Recent Labs  Lab 04/23/24 1524 04/24/24 0927  WBC 14.6* 11.1*  NEUTROABS 11.4*  --   HGB 14.8 13.5  HCT 41.7 39.0  MCV 87.1 89.2  PLT 238 199   Basic Metabolic Panel: Recent Labs  Lab 04/23/24 1524  NA 132*  K 3.4*  CL 100  CO2 20*  GLUCOSE 118*  BUN 22*  CREATININE 1.45*  CALCIUM  8.5*   Liver Function Tests: Recent Labs  Lab 04/23/24 1524  AST 27  ALT 42  ALKPHOS 82  BILITOT 0.7  PROT 6.3*  ALBUMIN  3.6   Recent Labs  Lab 04/23/24 1524  LIPASE 12   No results for input(s): AMMONIA in the last 168 hours. Cardiac Enzymes: No results for input(s): CKTOTAL, CKMB, CKMBINDEX, TROPONINI in the last 168 hours. BNP (last 3 results) No results for input(s): BNP in the last 8760 hours.  ProBNP (last 3 results) No results for input(s): PROBNP in the last 8760 hours.  CBG: No results for input(s): GLUCAP in the last 168 hours. Recent Results (from the past 240 hours)  Resp panel by RT-PCR (RSV, Flu A&B, Covid) Anterior Nasal Swab     Status: None   Collection Time: 04/23/24  3:29 PM   Specimen: Anterior Nasal Swab  Result Value Ref Range Status   SARS Coronavirus 2 by RT PCR NEGATIVE NEGATIVE Final   Influenza A by PCR NEGATIVE NEGATIVE Final   Influenza B by PCR NEGATIVE NEGATIVE Final    Comment: (NOTE) The Xpert Xpress SARS-CoV-2/FLU/RSV plus assay is intended as an aid in the diagnosis of influenza from Nasopharyngeal swab specimens and should not be used as a sole basis for treatment. Nasal washings and aspirates are unacceptable for Xpert Xpress SARS-CoV-2/FLU/RSV testing.  Fact Sheet for Patients: bloggercourse.com  Fact Sheet for Healthcare Providers: seriousbroker.it  This test is not yet approved or cleared by the [place]  FDA and has been authorized for detection and/or diagnosis of SARS-CoV-2 by FDA under an Emergency Use Authorization (EUA). This EUA will remain in effect (meaning this test can be used) for the duration of the COVID-19 declaration under Section 564(b)(1) of the Act, 21 U.S.C. section 360bbb-3(b)(1), unless the authorization is terminated or revoked.     Resp Syncytial Virus by PCR NEGATIVE NEGATIVE Final    Comment: (NOTE) Fact Sheet for Patients: bloggercourse.com  Fact Sheet for Healthcare  Providers: seriousbroker.it  This test is not yet approved or cleared by the [place]  FDA and has been authorized for detection and/or diagnosis of SARS-CoV-2 by FDA under an Emergency Use Authorization (EUA). This EUA will remain in effect (meaning this test can be used) for the duration of the COVID-19 declaration under Section 564(b)(1) of the Act, 21 U.S.C. section 360bbb-3(b)(1), unless the authorization is terminated or revoked.  Performed at [facility], [address], [place], [place] [address]   Gastrointestinal Panel by PCR , Stool     Status: Abnormal   Collection Time: 04/23/24  7:21 PM   Specimen: Stool  Result Value Ref Range Status   Campylobacter species NOT DETECTED NOT DETECTED Final   Plesimonas shigelloides NOT DETECTED NOT DETECTED Final   Salmonella species NOT DETECTED NOT DETECTED Final   Yersinia enterocolitica NOT DETECTED NOT DETECTED Final   Vibrio species NOT DETECTED NOT DETECTED Final   Vibrio cholerae NOT DETECTED NOT DETECTED Final   Enteroaggregative E coli (EAEC) DETECTED (A) NOT DETECTED Final    Comment: RESULT CALLED TO, READ BACK BY  AND VERIFIED WITH: MARIJO EASTERN RN [id number] 04/24/24 HNM    Enteropathogenic E coli (EPEC) NOT DETECTED NOT DETECTED Final   Enterotoxigenic E coli (ETEC) NOT DETECTED NOT DETECTED Final   Shiga like toxin producing E coli (STEC) NOT DETECTED NOT DETECTED Final   Shigella/Enteroinvasive E coli (EIEC) DETECTED (A) NOT DETECTED Final    Comment: RESULT CALLED TO, READ BACK BY AND VERIFIED WITH: SKYLAR HOFFMAN RN 0818 04/24/24 HNM    Cryptosporidium NOT DETECTED NOT DETECTED Final   Cyclospora cayetanensis NOT DETECTED NOT DETECTED Final   Entamoeba histolytica NOT DETECTED NOT DETECTED Final   Giardia lamblia NOT DETECTED NOT DETECTED Final   Adenovirus F40/41 NOT DETECTED NOT DETECTED Final   Astrovirus NOT DETECTED NOT DETECTED Final   Norovirus GI/GII NOT DETECTED NOT DETECTED  Final   Rotavirus A NOT DETECTED NOT DETECTED Final   Sapovirus (I, II, IV, and V) NOT DETECTED NOT DETECTED Final    Comment: Performed at [facility], [address] [place], [place] [address]  C Difficile Quick Screen w PCR reflex     Status: None   Collection Time: 04/23/24  7:23 PM   Specimen: Stool  Result Value Ref Range Status   C Diff antigen NEGATIVE NEGATIVE Final   C Diff toxin NEGATIVE NEGATIVE Final   C Diff interpretation No C. difficile detected.  Final    Comment: Performed at [facility], [address], [place], [place] [address]     Studies: DG Chest Port 1 View Result Date: 04/23/2024 CLINICAL DATA:  Fever EXAM: PORTABLE CHEST 1 VIEW COMPARISON:  None Available. FINDINGS: Normal lung volumes. No focal consolidations. No pleural effusion or pneumothorax. The heart size and mediastinal contours are within normal limits. No acute osseous abnormality. IMPRESSION: No active disease. Electronically Signed   By: Limin  Xu M.D.   On: 04/23/2024 16:18      Vernal Alstrom, MD  [facility] 04/24/2024  If 7PM-7AM, please contact night-coverage             "

## 2024-04-24 NOTE — Progress Notes (Addendum)
" ° ° °  EXPEDITER LEVEL LOADING ASSESSMENT NOTE  Patient Name: Billy Mann  DOB:May 12, 1980 Date of Admission: 04/23/2024  Date of Assessment:04/24/2024   -------------------------------------------------------------------------------------------------------------------   Brief clinical summary: Patient admitted with Diarrhea. Awaiting bed at [facility].  Is there Bed Availability at another [facility]? Yes  If yes, what facility: [facility]  Level of Care Needed:  Med Surg  MD Agree to transfer: Dr Sonjia to review. Admit order changed to [facility] or [facility].   Patient agrees to transfer: Yes. Sent secure chat to [employer] with bed assignment, [facility] 1506. Bed Dirty at this time.   ADDENDUM 1341: Patient bed ready on board. Message sent to [facility] with number to call report.     -------------------------------------------------------------------------------------------------------------------  [facility] RN Expediter, Lilybelle Mayeda Please contact us  directly via secure chat (search for [facility]) or by calling us  at [phone] [facility]).  "

## 2024-04-24 NOTE — Plan of Care (Signed)

## 2024-04-24 NOTE — ED Notes (Signed)
 Attempted x 2 to place fecal management system.  Pt was incontinent of stool x 6 prior to attempts and reported to this RN that he was unable to tell when it's coming.  Pt reported multiple times that he had no control of his bowels.  Pt was unable to tolerate placement of fecal management.  After attempts made, patient reports to this RN that he is able to control his bowels but does not let staff know when he has to have a bowel movement.  Reports that he is unable to stand and attempt bedside commode.  New sheets placed on bed and adult brief placed.

## 2024-04-24 NOTE — ED Notes (Signed)
 Pt unable to tolerate flexi-seal insertion. Bedside commode at bedside.

## 2024-04-24 NOTE — ED Notes (Signed)
 Patient used bedside commode without assistance.  Watery stool noted

## 2024-04-24 NOTE — ED Notes (Signed)
 Patient was able to use bedside commode without assistance.  Watery stool noted.

## 2024-04-25 ENCOUNTER — Institutional Professional Consult (permissible substitution): Payer: MEDICAID

## 2024-04-25 ENCOUNTER — Other Ambulatory Visit (HOSPITAL_COMMUNITY): Payer: Self-pay

## 2024-04-25 DIAGNOSIS — A041 Enterotoxigenic Escherichia coli infection: Secondary | ICD-10-CM | POA: Diagnosis not present

## 2024-04-25 DIAGNOSIS — E782 Mixed hyperlipidemia: Secondary | ICD-10-CM | POA: Diagnosis not present

## 2024-04-25 DIAGNOSIS — A09 Infectious gastroenteritis and colitis, unspecified: Secondary | ICD-10-CM

## 2024-04-25 DIAGNOSIS — F411 Generalized anxiety disorder: Secondary | ICD-10-CM

## 2024-04-25 DIAGNOSIS — A039 Shigellosis, unspecified: Secondary | ICD-10-CM | POA: Diagnosis not present

## 2024-04-25 LAB — CBC
HCT: 37.2 % — ABNORMAL LOW (ref 39.0–52.0)
Hemoglobin: 13.7 g/dL (ref 13.0–17.0)
MCH: 31.4 pg (ref 26.0–34.0)
MCHC: 36.8 g/dL — ABNORMAL HIGH (ref 30.0–36.0)
MCV: 85.3 fL (ref 80.0–100.0)
Platelets: 219 K/uL (ref 150–400)
RBC: 4.36 MIL/uL (ref 4.22–5.81)
RDW: 12.3 % (ref 11.5–15.5)
WBC: 11.9 K/uL — ABNORMAL HIGH (ref 4.0–10.5)
nRBC: 0 % (ref 0.0–0.2)

## 2024-04-25 LAB — BASIC METABOLIC PANEL WITH GFR
Anion gap: 9 (ref 5–15)
BUN: 17 mg/dL (ref 6–20)
CO2: 26 mmol/L (ref 22–32)
Calcium: 8.3 mg/dL — ABNORMAL LOW (ref 8.9–10.3)
Chloride: 99 mmol/L (ref 98–111)
Creatinine, Ser: 1.45 mg/dL — ABNORMAL HIGH (ref 0.61–1.24)
GFR, Estimated: >60 mL/min
Glucose, Bld: 97 mg/dL (ref 70–99)
Potassium: 3.1 mmol/L — ABNORMAL LOW (ref 3.5–5.1)
Sodium: 134 mmol/L — ABNORMAL LOW (ref 135–145)

## 2024-04-25 MED ORDER — ONDANSETRON 4 MG PO TBDP
4.0000 mg | ORAL_TABLET | Freq: Three times a day (TID) | ORAL | 0 refills | Status: AC | PRN
  Filled 2024-04-25: qty 20, 7d supply, fill #0

## 2024-04-25 MED ORDER — CIPROFLOXACIN HCL 500 MG PO TABS
500.0000 mg | ORAL_TABLET | Freq: Two times a day (BID) | ORAL | 0 refills | Status: AC
  Filled 2024-04-25: qty 10, 5d supply, fill #0

## 2024-04-25 MED ORDER — MAGNESIUM SULFATE 2 GM/50ML IV SOLN
2.0000 g | Freq: Once | INTRAVENOUS | Status: AC
  Administered 2024-04-25: 2 g via INTRAVENOUS
  Filled 2024-04-25: qty 50

## 2024-04-25 NOTE — Progress Notes (Signed)
" °   04/25/24 0841  TOC Brief Assessment  Insurance and Status Reviewed  Patient has primary care physician Yes  Home environment has been reviewed single family home  Prior level of function: independent  Prior/Current Home Services No current home services  Social Drivers of Health Review SDOH reviewed no interventions necessary  Readmission risk has been reviewed Yes  Transition of care needs no transition of care needs at this time    Signed: Heather Saltness, MSW, LCSW Clinical Social Worker Inpatient Care Management 04/25/2024 8:42 AM   "

## 2024-04-25 NOTE — Discharge Summary (Signed)
 " Physician Discharge Summary   Patient: Billy Mann MRN: [id number] DOB: 10/13/1980  Admit date:     04/23/2024  Discharge date: 04/25/24  Discharge Physician: Concepcion Riser   PCP: Jori Small, FNP   Recommendations at discharge:    PCP follow up in 1 week.  Discharge Diagnoses: Principal Problem:   Diarrhea Active Problems:   Generalized anxiety disorder   Mixed hyperlipidemia   Enterotoxigenic Escherichia coli infection   Shigella enteritis  Resolved Problems:   * No resolved hospital problems. *  Hospital Course: Billy Mann is a 44 y.o. male with past medical history significant of hypertension, hyperlipidemia presented to emergency department due to multiple episodes of diarrhea and difficulty keeping food down.  In the ED, patient developed fever of 102 F.  History of C. difficile in the past.  Initial labs were notable for WBC at 14.6 with creatinine of 1.4.  Respiratory viral panel was negative.  Lactic acid was 0.7.  Chest x-ray showed no infiltrate.  Stool tested positive for enterotoxigenic ecoli, shigella. Patient was given IV fluids and was admitted hospital for further evaluation and treatment.    Acute diarrhea with enteroinvasive E. coli and Shigella Patient had mild AKI and hypokalemia but C. difficile is negative.  He got supportive care.  Due to fever and severe diarrhea, he is started on Cipro  therapy. Today he is feeling much better, less diarrhea, no abdominal pain, nausea. Able to tolerate diet. He is hemodynamically stable to be discharged home. New script for Cipro , zofran  sent to pharmacy. Advised to quit marijuana. Advised to follow PCP in 1 week.  Hypokalemia.  Replaced..  Mild hyponatremia.  Improved with fluids.   Hyperlipidemia-continue atorvastatin .  Generalized weakness- Due to his illness.  Improved, able to ambulate well.  Assessment and Plan: No notes have been filed under this hospital service. Service:  Hospitalist        Consultants: none Procedures performed: none  Disposition: Home Diet recommendation:  Discharge Diet Orders (From admission, onward)     Start     Ordered   04/25/24 0000  Diet - low sodium heart healthy        04/25/24 1411           Cardiac diet DISCHARGE MEDICATION: Allergies as of 04/25/2024       Reactions   Penicillins Anaphylaxis        Medication List     STOP taking these medications    atorvastatin  20 MG tablet Commonly known as: LIPITOR   oseltamivir  75 MG capsule Commonly known as: Tamiflu    sildenafil  100 MG tablet Commonly known as: VIAGRA        TAKE these medications    albuterol  108 (90 Base) MCG/ACT inhaler Commonly known as: VENTOLIN  HFA Inhale 2 puffs into the lungs every 6 (six) hours as needed for wheezing or shortness of breath.   ciprofloxacin  500 MG tablet Commonly known as: Cipro  Take 1 tablet (500 mg total) by mouth 2 (two) times daily for 5 days.   Descovy 200-25 MG tablet Generic drug: emtricitabine-tenofovir AF Take 1 tablet by mouth daily.   famotidine  10 MG tablet Commonly known as: PEPCID  Take 10 mg by mouth 2 (two) times daily.   ondansetron  4 MG disintegrating tablet Commonly known as: ZOFRAN -ODT Take 1 tablet (4 mg total) by mouth every 8 (eight) hours as needed for nausea or vomiting.        Discharge Exam: Filed Weights   04/23/24 1521  Weight: 72.6 kg  04/25/2024    4:54 AM 04/24/2024   11:32 PM 04/24/2024    7:23 PM  Vitals with BMI  Systolic 119 110 887  Diastolic 78 69 63  Pulse 86 95 107    General -  Young  Caucasian male, no apparent distress HEENT - PERRLA, EOMI, atraumatic head, non tender sinuses. Lung - Clear, no rales, rhonchi, wheezes. Heart - S1, S2 heard, no murmurs, rubs, no pedal edema. Abdomen - Soft, non tender, bowel sounds good Neuro - Alert, awake and oriented x 3, non focal exam. Skin - Warm and dry.  Condition at discharge: stable  The results  of significant diagnostics from this hospitalization (including imaging, microbiology, ancillary and laboratory) are listed below for reference.   Imaging Studies: DG Chest Port 1 View Result Date: 04/23/2024 CLINICAL DATA:  Fever EXAM: PORTABLE CHEST 1 VIEW COMPARISON:  None Available. FINDINGS: Normal lung volumes. No focal consolidations. No pleural effusion or pneumothorax. The heart size and mediastinal contours are within normal limits. No acute osseous abnormality. IMPRESSION: No active disease. Electronically Signed   By: Limin  Xu M.D.   On: 04/23/2024 16:18    Microbiology: Results for orders placed or performed during the hospital encounter of 04/23/24  Resp panel by RT-PCR (RSV, Flu A&B, Covid) Anterior Nasal Swab     Status: None   Collection Time: 04/23/24  3:29 PM   Specimen: Anterior Nasal Swab  Result Value Ref Range Status   SARS Coronavirus 2 by RT PCR NEGATIVE NEGATIVE Final   Influenza A by PCR NEGATIVE NEGATIVE Final   Influenza B by PCR NEGATIVE NEGATIVE Final    Comment: (NOTE) The Xpert Xpress SARS-CoV-2/FLU/RSV plus assay is intended as an aid in the diagnosis of influenza from Nasopharyngeal swab specimens and should not be used as a sole basis for treatment. Nasal washings and aspirates are unacceptable for Xpert Xpress SARS-CoV-2/FLU/RSV testing.  Fact Sheet for Patients: bloggercourse.com  Fact Sheet for Healthcare Providers: seriousbroker.it  This test is not yet approved or cleared by the [place]  FDA and has been authorized for detection and/or diagnosis of SARS-CoV-2 by FDA under an Emergency Use Authorization (EUA). This EUA will remain in effect (meaning this test can be used) for the duration of the COVID-19 declaration under Section 564(b)(1) of the Act, 21 U.S.C. section 360bbb-3(b)(1), unless the authorization is terminated or revoked.     Resp Syncytial Virus by PCR NEGATIVE NEGATIVE  Final    Comment: (NOTE) Fact Sheet for Patients: bloggercourse.com  Fact Sheet for Healthcare Providers: seriousbroker.it  This test is not yet approved or cleared by the [place]  FDA and has been authorized for detection and/or diagnosis of SARS-CoV-2 by FDA under an Emergency Use Authorization (EUA). This EUA will remain in effect (meaning this test can be used) for the duration of the COVID-19 declaration under Section 564(b)(1) of the Act, 21 U.S.C. section 360bbb-3(b)(1), unless the authorization is terminated or revoked.  Performed at [facility], [address] [place], [place] [address]   Gastrointestinal Panel by PCR , Stool     Status: Abnormal   Collection Time: 04/23/24  7:21 PM   Specimen: Stool  Result Value Ref Range Status   Campylobacter species NOT DETECTED NOT DETECTED Final   Plesimonas shigelloides NOT DETECTED NOT DETECTED Final   Salmonella species NOT DETECTED NOT DETECTED Final   Yersinia enterocolitica NOT DETECTED NOT DETECTED Final   Vibrio species NOT DETECTED NOT DETECTED Final   Vibrio cholerae NOT  DETECTED NOT DETECTED Final   Enteroaggregative E coli (EAEC) DETECTED (A) NOT DETECTED Final    Comment: RESULT CALLED TO, READ BACK BY AND VERIFIED WITH: SKYLAR HOFFMAN RN 0818 04/24/24 HNM    Enteropathogenic E coli (EPEC) NOT DETECTED NOT DETECTED Final   Enterotoxigenic E coli (ETEC) NOT DETECTED NOT DETECTED Final   Shiga like toxin producing E coli (STEC) NOT DETECTED NOT DETECTED Final   Shigella/Enteroinvasive E coli (EIEC) DETECTED (A) NOT DETECTED Final    Comment: RESULT CALLED TO, READ BACK BY AND VERIFIED WITH: SKYLAR HOFFMAN RN 0818 04/24/24 HNM    Cryptosporidium NOT DETECTED NOT DETECTED Final   Cyclospora cayetanensis NOT DETECTED NOT DETECTED Final   Entamoeba histolytica NOT DETECTED NOT DETECTED Final   Giardia lamblia NOT DETECTED NOT DETECTED Final   Adenovirus F40/41  NOT DETECTED NOT DETECTED Final   Astrovirus NOT DETECTED NOT DETECTED Final   Norovirus GI/GII NOT DETECTED NOT DETECTED Final   Rotavirus A NOT DETECTED NOT DETECTED Final   Sapovirus (I, II, IV, and V) NOT DETECTED NOT DETECTED Final    Comment: Performed at [facility], [address] [place], [place] [address]  C Difficile Quick Screen w PCR reflex     Status: None   Collection Time: 04/23/24  7:23 PM   Specimen: Stool  Result Value Ref Range Status   C Diff antigen NEGATIVE NEGATIVE Final   C Diff toxin NEGATIVE NEGATIVE Final   C Diff interpretation No C. difficile detected.  Final    Comment: Performed at [facility], [address], [place], [place] [address]    Labs: CBC: Recent Labs  Lab 04/23/24 1524 04/24/24 0927 04/25/24 1253  WBC 14.6* 11.1* 11.9*  NEUTROABS 11.4*  --   --   HGB 14.8 13.5 13.7  HCT 41.7 39.0 37.2*  MCV 87.1 89.2 85.3  PLT 238 199 219   Basic Metabolic Panel: Recent Labs  Lab 04/23/24 1524 04/24/24 0927  NA 132* 137  K 3.4* 3.2*  CL 100 106  CO2 20* 18*  GLUCOSE 118* 108*  BUN 22* 29*  CREATININE 1.45* 2.16*  CALCIUM  8.5* 6.9*  MG  --  1.5*   Liver Function Tests: Recent Labs  Lab 04/23/24 1524  AST 27  ALT 42  ALKPHOS 82  BILITOT 0.7  PROT 6.3*  ALBUMIN 3.6   CBG: No results for input(s): GLUCAP in the last 168 hours.  Discharge time spent: 35 minutes.  Signed: Concepcion Riser, MD [facility] 04/25/2024 "

## 2024-04-25 NOTE — Progress Notes (Signed)
 Discharge meds in a secure bag delivered to patient by this RN

## 2024-04-25 NOTE — Progress Notes (Signed)
 AVS reviewed with patient who verbalized an understanding- ride waiting downstais

## 2024-04-25 NOTE — Plan of Care (Signed)

## 2024-04-26 ENCOUNTER — Ambulatory Visit: Payer: MEDICAID

## 2024-04-27 ENCOUNTER — Ambulatory Visit: Payer: MEDICAID

## 2024-04-28 ENCOUNTER — Telehealth: Payer: Self-pay

## 2024-04-28 NOTE — Telephone Encounter (Signed)
 Patient's PCP called office requesting urgent appointment due to multiple concerns. 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 Attempted to call patient to schedule appointment to return to care and discuss concerns noted by PCP. Number listed not in service.  PCP will have patient go to ED for further work up.  Lorenda CHRISTELLA Code, RMA

## 2024-04-29 ENCOUNTER — Telehealth: Payer: MEDICAID

## 2024-05-01 NOTE — Telephone Encounter (Signed)
 Received voicemail from patient returning call.   Called Sargeant back, no answer and voicemail full.   No upcoming provider availability to address mpox concerns. Patient was discharged from the hospital on 1/6, but unclear if he went back to ED at advice of PCP.   If interested in restarting PrEP, can schedule with pharmacy team.   Jilliane Kazanjian, BSN, RN

## 2024-05-10 ENCOUNTER — Ambulatory Visit: Payer: MEDICAID | Admitting: Podiatry

## 2024-05-17 ENCOUNTER — Institutional Professional Consult (permissible substitution): Payer: MEDICAID

## 2024-05-18 LAB — MISCELLANEOUS TEST

## 2024-05-29 ENCOUNTER — Inpatient Hospital Stay: Payer: MEDICAID

## 2024-05-29 ENCOUNTER — Inpatient Hospital Stay: Payer: MEDICAID | Admitting: Hematology and Oncology
